# Patient Record
Sex: Female | Born: 1990 | Race: Black or African American | Hispanic: No | Marital: Single | Smoking: Never smoker
Health system: Southern US, Community
[De-identification: ages and names within clinical notes are randomized; demographics above are authoritative.]

## PROBLEM LIST (undated history)

## (undated) DIAGNOSIS — B9689 Other specified bacterial agents as the cause of diseases classified elsewhere: Secondary | ICD-10-CM

## (undated) DIAGNOSIS — K219 Gastro-esophageal reflux disease without esophagitis: Secondary | ICD-10-CM

## (undated) DIAGNOSIS — R87619 Unspecified abnormal cytological findings in specimens from cervix uteri: Secondary | ICD-10-CM

## (undated) DIAGNOSIS — R87810 Cervical high risk human papillomavirus (HPV) DNA test positive: Secondary | ICD-10-CM

## (undated) DIAGNOSIS — N83209 Unspecified ovarian cyst, unspecified side: Secondary | ICD-10-CM

## (undated) DIAGNOSIS — N760 Acute vaginitis: Secondary | ICD-10-CM

## (undated) DIAGNOSIS — N390 Urinary tract infection, site not specified: Secondary | ICD-10-CM

## (undated) DIAGNOSIS — R1310 Dysphagia, unspecified: Secondary | ICD-10-CM

## (undated) DIAGNOSIS — K5900 Constipation, unspecified: Secondary | ICD-10-CM

## (undated) DIAGNOSIS — R079 Chest pain, unspecified: Secondary | ICD-10-CM

## (undated) DIAGNOSIS — K5281 Eosinophilic gastritis or gastroenteritis: Secondary | ICD-10-CM

## (undated) DIAGNOSIS — Z8719 Personal history of other diseases of the digestive system: Secondary | ICD-10-CM

## (undated) DIAGNOSIS — R87610 Atypical squamous cells of undetermined significance on cytologic smear of cervix (ASC-US): Secondary | ICD-10-CM

## (undated) HISTORY — DX: Other specified bacterial agents as the cause of diseases classified elsewhere: B96.89

## (undated) HISTORY — DX: Atypical squamous cells of undetermined significance on cytologic smear of cervix (ASC-US): R87.610

## (undated) HISTORY — DX: Cervical high risk human papillomavirus (HPV) DNA test positive: R87.810

## (undated) HISTORY — DX: Unspecified abnormal cytological findings in specimens from cervix uteri: R87.619

## (undated) HISTORY — PX: INTRAUTERINE DEVICE (IUD) INSERTION: SHX5877

## (undated) HISTORY — PX: OTHER SURGICAL HISTORY: SHX169

## (undated) HISTORY — DX: Urinary tract infection, site not specified: N39.0

## (undated) HISTORY — DX: Constipation, unspecified: K59.00

## (undated) HISTORY — DX: Other specified bacterial agents as the cause of diseases classified elsewhere: N76.0

---

## 2010-07-28 ENCOUNTER — Emergency Department (HOSPITAL_COMMUNITY): Admission: EM | Admit: 2010-07-28 | Discharge: 2010-07-29 | Payer: Self-pay | Admitting: Emergency Medicine

## 2010-08-16 ENCOUNTER — Emergency Department (HOSPITAL_COMMUNITY): Admission: EM | Admit: 2010-08-16 | Discharge: 2010-08-02 | Payer: Self-pay | Admitting: Emergency Medicine

## 2010-09-09 HISTORY — PX: OTHER SURGICAL HISTORY: SHX169

## 2010-09-09 HISTORY — PX: APPENDECTOMY: SHX54

## 2010-11-20 LAB — BASIC METABOLIC PANEL
CO2: 24 mEq/L (ref 19–32)
Chloride: 102 mEq/L (ref 96–112)
Creatinine, Ser: 0.79 mg/dL (ref 0.4–1.2)
GFR calc Af Amer: >60 mL/min (ref >60)
Potassium: 3.8 mEq/L (ref 3.5–5.1)

## 2010-11-20 LAB — WET PREP, GENITAL: Yeast Wet Prep HPF POC: NONE SEEN

## 2010-11-20 LAB — POCT PREGNANCY, URINE: Preg Test, Ur: NEGATIVE

## 2010-11-20 LAB — CBC
HCT: 38.1 % (ref 36.0–46.0)
Hemoglobin: 13.4 g/dL (ref 12.0–15.0)
MCH: 31.5 pg (ref 26.0–34.0)
MCV: 89.9 fL (ref 78.0–100.0)
RBC: 4.24 MIL/uL (ref 3.87–5.11)
WBC: 2.6 10*3/uL — ABNORMAL LOW (ref 4.0–10.5)

## 2010-11-20 LAB — DIFFERENTIAL
Band Neutrophils: 0 % (ref 0–10)
Basophils Absolute: 0.1 10*3/uL (ref 0.0–0.1)
Basophils Relative: 2 % — ABNORMAL HIGH (ref 0–1)
Eosinophils Absolute: 0.1 10*3/uL (ref 0.0–0.7)
Eosinophils Relative: 5 % (ref 0–5)
Metamyelocytes Relative: 0 %
Monocytes Absolute: 0.5 10*3/uL (ref 0.1–1.0)
Monocytes Relative: 21 % — ABNORMAL HIGH (ref 3–12)
Myelocytes: 0 %
nRBC: 0 /100 WBC

## 2010-11-20 LAB — URINALYSIS, ROUTINE W REFLEX MICROSCOPIC
Bilirubin Urine: NEGATIVE
Glucose, UA: NEGATIVE mg/dL
Hgb urine dipstick: NEGATIVE
Specific Gravity, Urine: 1.025 (ref 1.005–1.030)
Urobilinogen, UA: 1 mg/dL (ref 0.0–1.0)

## 2010-11-20 LAB — GC/CHLAMYDIA PROBE AMP, GENITAL
Chlamydia, DNA Probe: NEGATIVE
GC Probe Amp, Genital: NEGATIVE

## 2012-04-02 ENCOUNTER — Encounter (HOSPITAL_COMMUNITY)
Admission: RE | Disposition: A | Discharge status: Home / Self Care | Payer: Self-pay | Source: Ambulatory Visit | Attending: Gastroenterology

## 2012-04-02 ENCOUNTER — Encounter (HOSPITAL_COMMUNITY): Payer: Self-pay | Admitting: *Deleted

## 2012-04-02 ENCOUNTER — Telehealth: Payer: Self-pay

## 2012-04-02 ENCOUNTER — Ambulatory Visit (HOSPITAL_COMMUNITY)
Admission: RE | Admit: 2012-04-02 | Discharge: 2012-04-02 | Disposition: A | Discharge status: Home / Self Care | Payer: BC Managed Care – PPO | Source: Ambulatory Visit | Attending: Gastroenterology | Admitting: Gastroenterology

## 2012-04-02 ENCOUNTER — Telehealth: Payer: Self-pay | Admitting: Gastroenterology

## 2012-04-02 DIAGNOSIS — R1310 Dysphagia, unspecified: Secondary | ICD-10-CM | POA: Insufficient documentation

## 2012-04-02 DIAGNOSIS — K2940 Chronic atrophic gastritis without bleeding: Secondary | ICD-10-CM | POA: Insufficient documentation

## 2012-04-02 DIAGNOSIS — K2990 Gastroduodenitis, unspecified, without bleeding: Secondary | ICD-10-CM

## 2012-04-02 DIAGNOSIS — Q391 Atresia of esophagus with tracheo-esophageal fistula: Secondary | ICD-10-CM | POA: Insufficient documentation

## 2012-04-02 DIAGNOSIS — K449 Diaphragmatic hernia without obstruction or gangrene: Secondary | ICD-10-CM | POA: Insufficient documentation

## 2012-04-02 DIAGNOSIS — Q393 Congenital stenosis and stricture of esophagus: Secondary | ICD-10-CM | POA: Insufficient documentation

## 2012-04-02 DIAGNOSIS — K219 Gastro-esophageal reflux disease without esophagitis: Secondary | ICD-10-CM

## 2012-04-02 DIAGNOSIS — R079 Chest pain, unspecified: Secondary | ICD-10-CM

## 2012-04-02 DIAGNOSIS — K2970 Gastritis, unspecified, without bleeding: Secondary | ICD-10-CM

## 2012-04-02 HISTORY — PX: ESOPHAGOGASTRODUODENOSCOPY: SHX1529

## 2012-04-02 HISTORY — DX: Gastro-esophageal reflux disease without esophagitis: K21.9

## 2012-04-02 LAB — CBC WITH DIFFERENTIAL/PLATELET
ALT: 12 U/L (ref 7–35)
Albumin: 4.3
Alkaline Phosphatase: 76 U/L
BUN: 10 mg/dL (ref 4–21)
Chloride: 104 mmol/L
HCT: 40 %
Hemoglobin: 13.4 g/dL (ref 12.0–16.0)
Platelets: 197 10*3/uL
Total Bilirubin: 0.9 mg/dL

## 2012-04-02 SURGERY — ESOPHAGOGASTRODUODENOSCOPY (EGD) WITH ESOPHAGEAL DILATION
Anesthesia: Moderate Sedation

## 2012-04-02 MED ORDER — SODIUM CHLORIDE 0.45 % IV SOLN
Freq: Once | INTRAVENOUS | Status: AC
  Administered 2012-04-02: 1000 mL via INTRAVENOUS

## 2012-04-02 MED ORDER — MIDAZOLAM HCL 5 MG/5ML IJ SOLN
INTRAMUSCULAR | Status: AC
  Filled 2012-04-02: qty 10

## 2012-04-02 MED ORDER — MIDAZOLAM HCL 5 MG/5ML IJ SOLN
INTRAMUSCULAR | Status: DC | PRN
  Administered 2012-04-02: 2 mg via INTRAVENOUS
  Administered 2012-04-02 (×2): 1 mg via INTRAVENOUS
  Administered 2012-04-02: 2 mg via INTRAVENOUS
  Administered 2012-04-02: 1 mg via INTRAVENOUS

## 2012-04-02 MED ORDER — MEPERIDINE HCL 100 MG/ML IJ SOLN
INTRAMUSCULAR | Status: DC | PRN
  Administered 2012-04-02 (×2): 50 mg via INTRAVENOUS

## 2012-04-02 MED ORDER — OMEPRAZOLE 20 MG PO CPDR: DELAYED_RELEASE_CAPSULE | ORAL | Status: DC

## 2012-04-02 MED ORDER — STERILE WATER FOR IRRIGATION IR SOLN
Status: DC | PRN
  Administered 2012-04-02: 13:00:00

## 2012-04-02 MED ORDER — MEPERIDINE HCL 100 MG/ML IJ SOLN
INTRAMUSCULAR | Status: AC
  Filled 2012-04-02: qty 2

## 2012-04-02 NOTE — Telephone Encounter (Signed)
REVIEWED.  

## 2012-04-02 NOTE — H&P (Signed)
  Primary Care Physician:  Sheila Oats, MD Primary Gastroenterologist:  Dr. Darrick Penna  Pre-Procedure History & Physical: HPI:  Beth Peterson is a 21 y.o. female here for CHEST PAIN (PRESSURE) AFTER TAKING FLAGYL. HAS PROBLEM TAKING PILLS. NO PAIN WITH SWALLOWING. REFLUX UNCONTROLLED WITH TUMS. TAKES TUMS EVERY DAY. HAS NAUSEA. NO VOMITING, ABD PAIN, DIARRHEA, MELENA, OR BRBPR. INTENTIONAL WEIGHT LOSS. APPETITE GOOD. LMP: ? WAS ON DEPO AND NOW ON MINERVA. SX RELIEVED WITH GI COCKTAIL YESTERDAY BUT CAM BACK THIS AM. MISSING WORK DUE TO PAIN.   Past Medical History  Diagnosis Date  . GERD (gastroesophageal reflux disease)     Past Surgical History  Procedure Date  . Uterus tacked 2012    Prior to Admission medications   Medication Sig Start Date End Date Taking? Authorizing Provider  metroNIDAZOLE (FLAGYL) 500 MG tablet Take 500 mg by mouth as needed.   Yes Historical Provider, MD    Allergies as of 04/02/2012  . (No Known Allergies)    History reviewed. No pertinent family history.  History   Social History  . Marital Status: Single    Spouse Name: N/A    Number of Children: N/A  . Years of Education: N/A   Occupational History  . Not on file.   Social History Main Topics  . Smoking status: Never Smoker   . Smokeless tobacco: Not on file  . Alcohol Use: Yes     occasional  . Drug Use: No  . Sexually Active: Yes    Birth Control/ Protection: IUD   Other Topics Concern  . Not on file   Social History Narrative  . No narrative on file    Review of Systems: See HPI, otherwise negative ROS   Physical Exam: BP 119/74  Temp 98.1 F (36.7 C) (Oral)  Resp 18  Ht 5\' 4"  (1.626 m)  Wt 163 lb (73.936 kg)  BMI 27.98 kg/m2  SpO2 100% General:   Alert,  pleasant and cooperative in NAD Head:  Normocephalic and atraumatic. Neck:  Supple;  Lungs:  Clear throughout to auscultation.    Heart:  Regular rate and rhythm. Abdomen:  Soft, nontender and nondistended.  Normal bowel sounds, without guarding, and without rebound.   Neurologic:  Alert and  oriented x4;  grossly normal neurologically.  Impression/Plan:    CHEST PAIN LIKEY DUE TO PILL ESOPHAGITIS. GERD NOT APPROPRIATELY TREATED AND NOT CONTROLLED. PT AT RISK FOR PEPTIC STRICTURE.  PLAN:  1. EGD/? DIL TODAY.

## 2012-04-02 NOTE — Telephone Encounter (Signed)
Need urgent appt due to severe dysphagia.

## 2012-04-02 NOTE — Telephone Encounter (Signed)
Doctor from [facility] call this morning wanting to know if we could see her ASAP because she can not swallow. I told them I would call them back. I paged SLF to see if she would like to go ahead and do an EGD or did she want [place] to see her in the office. SLF called back and said to see the Pt to Endo. Call Consuella Lose at [facility] and told her to please send her straight to the Endo department.

## 2012-04-03 NOTE — Op Note (Signed)
[facility] [address] [place], [place]  [address]  ENDOSCOPY PROCEDURE REPORT  PATIENT:  Beth Peterson, Beth Peterson  MR#:  [id number] BIRTHDATE:  [birth year], 21 yrs. old  GENDER:  female  ENDOSCOPIST:  Jonette Eva, MD ASSISTANT: Referred by:  Reynolds Bowl, M.D.  PROCEDURE DATE:  04/02/2012 PROCEDURE:  EGD with dilatation over guidewire, EGD with biopsy ASA CLASS: INDICATIONS:  CHEST PAIN AFTER SWALLOWING FLAGYL. PT HAS PROBLEMS SWALLOWING PILLS, REFLUX UNCONTROLLED.  MEDICATIONS:   Demerol 100 mg IV, Versed 7 mg IV TOPICAL ANESTHETIC:  Cetacaine Spray  DESCRIPTION OF PROCEDURE:   After the risks benefits and alternatives of the procedure were thoroughly explained, informed consent was obtained.  The EG-[id number] ([id number]) endoscope was introduced through the mouth and advanced to the second portion of the duodenum.  The instrument was slowly withdrawn as the mucosa was carefully examined.  Prior to withdrawal of the scope, the guidwire was placed.  The esophagus was dilated successfully.  The patient was recovered in endoscopy and discharged home in satisfactory condition. 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  NO ULCERS OR BARETT'S SEENIN THE ESOPHAGUS. A 1-2 CM hiatal hernia was found.  Mild gastritis was found & BIOPSIED VIA COLD FORCEPS.    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 CONTINUE WEIGHT LOSS  EFFORTS. OPV IN 3 MOS.  REPEAT  EXAM:  No  ______________________________ Jonette Eva, MD  CC:  n. eSIGNED:   Jaqlyn Gruenhagen at 04/03/2012 09:41 AM  Audrea Muscat, [phone]

## 2012-04-10 ENCOUNTER — Telehealth: Payer: Self-pay | Admitting: Gastroenterology

## 2012-04-10 NOTE — Telephone Encounter (Signed)
Please call pt. HER stomach Bx shows mild gastritis.HER ESOPHAGUS BIOPSIES ARE NORMAL. Continue OMP 30 minutes prior to meals BID FOR ONE MORE WEEK AND THEN ONCE DAILY. FOLLOW A LOW FTA DIET. OPV IN 3 MOS.

## 2012-04-10 NOTE — Telephone Encounter (Signed)
Left message for her to call us back.  

## 2012-04-10 NOTE — Telephone Encounter (Signed)
Recall made, 3 month OPV

## 2012-04-13 NOTE — Telephone Encounter (Signed)
Called and informed pt.  

## 2012-05-08 ENCOUNTER — Encounter: Payer: Self-pay | Admitting: Urgent Care

## 2012-05-08 ENCOUNTER — Ambulatory Visit (INDEPENDENT_AMBULATORY_CARE_PROVIDER_SITE_OTHER): Payer: BC Managed Care – PPO | Admitting: Urgent Care

## 2012-05-08 VITALS — BP 122/84 | HR 81 | Temp 98.0°F | Ht 63.0 in | Wt 155.0 lb

## 2012-05-08 DIAGNOSIS — K219 Gastro-esophageal reflux disease without esophagitis: Secondary | ICD-10-CM | POA: Insufficient documentation

## 2012-05-08 DIAGNOSIS — R1310 Dysphagia, unspecified: Secondary | ICD-10-CM | POA: Insufficient documentation

## 2012-05-08 DIAGNOSIS — R079 Chest pain, unspecified: Secondary | ICD-10-CM

## 2012-05-08 MED ORDER — OMEPRAZOLE 20 MG PO CPDR: 20.0000 mg | DELAYED_RELEASE_CAPSULE | Freq: Two times a day (BID) | ORAL | Status: DC

## 2012-05-08 NOTE — Progress Notes (Signed)
Referring Provider: Default, Provider, MD Primary Care Physician:  Ninfa Linden, NP Primary Gastroenterologist:  Dr. Jonette Eva  Chief Complaint  Patient presents with  . Chest Pain    HPI:  Beth Peterson is a 21 y.o. female here for follow up for dysphasia and chest pressure.  She feels like food & pills stop retrosternally.  She continues to have problems despite recent EGD with savory dilation. She had pain that continued 3 hrs after eating a banana the other day.  Denies any problems w/ liquids.  C/o nausea.  Denies vomiting or regurgitation.  C/o heartburn & indigestion despite omeprazole 20mg  daily.  When she was taking omeprazole 20 mg BID for 2 weeks after her EGD, she felt like she did a bit better.  Weight stable.  Appetite ok, but not eating as much due to pain w/ eating.  C/o frequent headaches & dizziness.  Does not have PCP.    Past Medical History  Diagnosis Date  . GERD (gastroesophageal reflux disease)     Past Surgical History  Procedure Date  . Uterus tacked 2012  . Esophagogastroduodenoscopy 04/02/12    Fields-proximal esophageal web, small hiatal hernia, mild non-H. pylori gastritis, 12.8-16mm savory dilation    Current Outpatient Prescriptions  Medication Sig Dispense Refill  . omeprazole (PRILOSEC) 20 MG capsule Take 1 capsule (20 mg total) by mouth 2 (two) times daily before a meal.  60 capsule  2  . DISCONTD: omeprazole (PRILOSEC) 20 MG capsule 1 po bid 30 minutes before meals bid for 2 weeks then once daily FOREVER  62 capsule  11  . DISCONTD: omeprazole (PRILOSEC) 20 MG capsule Take 20 mg by mouth daily.      Marland Kitchen ibuprofen (ADVIL,MOTRIN) 800 MG tablet Take 1 tablet by mouth as needed.        Allergies as of 05/08/2012  . (No Known Allergies)    Review of Systems: Gen: Denies any fever, chills, sweats, anorexia, fatigue, weakness, malaise, weight loss, and sleep disorder CV: Denies chest pain, angina, palpitations, syncope, orthopnea, PND,  peripheral edema, and claudication. Resp: Denies dyspnea at rest, dyspnea with exercise, cough, sputum, wheezing, coughing up blood, and pleurisy. GI: Denies vomiting blood, jaundice, and fecal incontinence.   Derm: Denies rash, itching, dry skin, hives, moles, warts, or unhealing ulcers.  Psych: Denies depression, anxiety, memory loss, suicidal ideation, hallucinations, paranoia, and confusion. Heme: Denies bruising, bleeding, and enlarged lymph nodes.  Physical Exam: BP 122/84  Pulse 81  Temp 98 F (36.7 C) (Temporal)  Ht 5\' 3"  (1.6 m)  Wt 155 lb (70.308 kg)  BMI 27.46 kg/m2  LMP 06/10/2011 General:   Alert,  Well-developed, well-nourished, pleasant and cooperative in NAD Eyes:  Sclera clear, no icterus.   Conjunctiva pink. Mouth:  No deformity or lesions, oropharynx pink and moist. Neck:  Supple; no masses or thyromegaly. Heart:  Regular rate and rhythm; no murmurs, clicks, rubs,  or gallops. Abdomen:  Normal bowel sounds.  No bruits.  Soft, non-tender and non-distended without masses, hepatosplenomegaly or hernias noted.  No guarding or rebound tenderness.   Rectal:  Deferred.  Msk:  Symmetrical without gross deformities.  Pulses:  Normal pulses noted. Extremities:  No clubbing or edema. Neurologic:  Alert and oriented x4;  grossly normal neurologically. Skin:  Intact without significant lesions or rashes.

## 2012-05-08 NOTE — Assessment & Plan Note (Signed)
Resume twice a day omeprazole 20mg .

## 2012-05-08 NOTE — Patient Instructions (Addendum)
Begin omeprazole 20mg  before breakfast & dinner Ba pill esophogram.  I will call you w/ results.

## 2012-05-08 NOTE — Assessment & Plan Note (Addendum)
Beth Peterson is a pleasant 21 y.o. female with persistent solid food and pill dysphagia despite recent savory dilation. She also has a significant amount of chest pain with eating. Differential diagnoses include achalasia, esophageal motility disorder spasm, or non-GI source of chest pain.  BPE for further evaluation We'll request recent lab work from [facility] for review

## 2012-05-12 ENCOUNTER — Ambulatory Visit (HOSPITAL_COMMUNITY)
Admission: RE | Admit: 2012-05-12 | Discharge: 2012-05-12 | Disposition: A | Discharge status: Home / Self Care | Payer: BC Managed Care – PPO | Source: Ambulatory Visit | Attending: Urgent Care | Admitting: Urgent Care

## 2012-05-12 DIAGNOSIS — K449 Diaphragmatic hernia without obstruction or gangrene: Secondary | ICD-10-CM | POA: Insufficient documentation

## 2012-05-12 DIAGNOSIS — R1310 Dysphagia, unspecified: Secondary | ICD-10-CM | POA: Insufficient documentation

## 2012-05-12 DIAGNOSIS — K219 Gastro-esophageal reflux disease without esophagitis: Secondary | ICD-10-CM | POA: Insufficient documentation

## 2012-05-12 NOTE — Progress Notes (Signed)
Faxed to PCP

## 2012-05-13 NOTE — Progress Notes (Signed)
Called, Pt's mom answered. She was aware that pt would have samples. I told her I would schedule the follow up with PCP. I called [facility] and spoke with Debbie. Scheduled  Pt an appt for 05/25/2012 @ 1:30 PM with Dr. Agustin Cree. I will call and let pt's mom know.

## 2012-05-13 NOTE — Progress Notes (Signed)
Called pt's mom with date and time of appt. Asked them to please call [facility] if time is not suitable.

## 2012-05-13 NOTE — Progress Notes (Signed)
Quick Note:  Discussed w/ pt. Feels constipated & omeprazole 20mg  BID helps some but still has reflux. Please give pt DEXILANT 60mg  samples as below. Advised Miralax 17 grams daily prn constipation, hold if diarrhea. Call w/ PR in 10 days re: dexilant thanks ______

## 2012-05-13 NOTE — Progress Notes (Signed)
Quick Note:  Pt's mom aware samples are ready for pick up. ______

## 2012-05-13 NOTE — Progress Notes (Signed)
Quick Note:  Left message with female for return call. Patient has significant reflux and hiatal hernia on this study. No stricture or blockage. How does she feel on twice a day omeprazole? If not 100% relief, we should try Dexilant 60 mg daily and samples can be given for 2 weeks. Thanks CC: Forest Gleason, MD  ______

## 2012-05-13 NOTE — Progress Notes (Signed)
Labs reviewed from Beth Peterson 04/02/12. White blood cell count 3.2 is low. She will need followup with [facility] to have this rechecked soon. Please be sure she has appointment. Hemoglobin, hematocrit, platelet count normal. CMP normal. H. pylori negative. Thanks

## 2012-05-14 NOTE — Progress Notes (Signed)
Quick Note:    reviewed  ______

## 2012-06-29 ENCOUNTER — Telehealth: Payer: Self-pay | Admitting: *Deleted

## 2012-06-29 NOTE — Telephone Encounter (Signed)
Beth Peterson called today. She recently had strep throat and her Ob gyn put her on penacillinan. Since then, her acid reflux has come back and the medication she is taking is not working. Please follow up.

## 2012-06-29 NOTE — Telephone Encounter (Signed)
Called pt. She said she tried the Dexilant and it did OK, but insurance would not cover it, so [facility] put her on pantoprozole 40 mg qd. She said it is not helping

## 2012-06-30 ENCOUNTER — Telehealth: Payer: Self-pay

## 2012-06-30 NOTE — Telephone Encounter (Signed)
Please give pt 2 weeks dexilant 60mg  samples. Let's see if we can get Prior Auth on insurance. Thanks

## 2012-06-30 NOTE — Telephone Encounter (Signed)
Working on PA

## 2012-06-30 NOTE — Telephone Encounter (Signed)
Called and informed pt. Samples at front for pick up.  

## 2012-06-30 NOTE — Telephone Encounter (Signed)
Routing to Lorenza Burton, NP who saw pt last.

## 2012-06-30 NOTE — Telephone Encounter (Signed)
Pt called back to say she could not pick up the dexilant today. Also, that she has not eaten anything since yesterday. She had baked chicken, turnip greens and mashed potatoes yesterday and was up til 2 AM this morning with reflux. She feels nausea, feels like the food comes up and goes back down, but she has not vomited. She took one of the Pantoprazole this AM, but it did not help. Please advise!  ( She can be reached at [phone] for the next hour on her lunch break).

## 2012-06-30 NOTE — Telephone Encounter (Signed)
Called and informed pt per Lorenza Burton, NP, she may take a zantac or either another Pantoprozole today and try to get the Dexilant as soon as possible.

## 2012-07-01 NOTE — Telephone Encounter (Signed)
PA form on AS desk. 

## 2012-07-03 ENCOUNTER — Telehealth: Payer: Self-pay

## 2012-07-03 NOTE — Telephone Encounter (Signed)
She needs appt to discuss further.  In interim, OTC colace BID & 1 Fleets enema for constipation.  Stop Dexilant.   OV 1st of next week. Thanks

## 2012-07-03 NOTE — Telephone Encounter (Signed)
Vm from pt this AM transferred from [place]. I called pt. She said she can still hardly eat anything. She has not had a good BM since Mon. ( she has not been taking the Miralax, said it caused diarrhea). She had black ball stools yesterday. She has pain in her lower right side. Nausea but no vomiting. She was taking the Dexilant, but had chest pain after she took it yesterday, so she is not taking it this AM. She said her pain is not so bad until she starts moving around.  Please advise!

## 2012-07-03 NOTE — Telephone Encounter (Signed)
Called and informed pt. Appt with Beth Peterson on 07/06/2012 at 10:30 AM.

## 2012-07-06 ENCOUNTER — Ambulatory Visit (INDEPENDENT_AMBULATORY_CARE_PROVIDER_SITE_OTHER): Payer: BC Managed Care – PPO | Admitting: Urgent Care

## 2012-07-06 ENCOUNTER — Encounter: Payer: Self-pay | Admitting: Urgent Care

## 2012-07-06 VITALS — BP 124/75 | HR 89 | Temp 97.5°F | Ht 63.0 in | Wt 147.4 lb

## 2012-07-06 DIAGNOSIS — R1310 Dysphagia, unspecified: Secondary | ICD-10-CM

## 2012-07-06 DIAGNOSIS — K5900 Constipation, unspecified: Secondary | ICD-10-CM

## 2012-07-06 DIAGNOSIS — K219 Gastro-esophageal reflux disease without esophagitis: Secondary | ICD-10-CM

## 2012-07-06 DIAGNOSIS — R350 Frequency of micturition: Secondary | ICD-10-CM

## 2012-07-06 DIAGNOSIS — R079 Chest pain, unspecified: Secondary | ICD-10-CM

## 2012-07-06 MED ORDER — LINACLOTIDE 290 MCG PO CAPS: 290.0000 ug | ORAL_CAPSULE | Freq: Every day | ORAL | Status: DC

## 2012-07-06 MED ORDER — LINACLOTIDE 290 MCG PO CAPS: 1.0000 | ORAL_CAPSULE | Freq: Every day | ORAL | Status: DC

## 2012-07-06 MED ORDER — PANTOPRAZOLE SODIUM 40 MG PO TBEC: 40.0000 mg | DELAYED_RELEASE_TABLET | Freq: Every day | ORAL | Status: DC

## 2012-07-06 NOTE — Patient Instructions (Addendum)
Go to lab for urine specimen Linzess daily for constipation-Hold if diarrhea Pantoprazole 40mg  daily for acid reflux Call if no relief. Office visit in 3 months w/ Dr Darrick Penna

## 2012-07-06 NOTE — Progress Notes (Signed)
Primary Care Physician:  Forest Gleason, MD Primary Gastroenterologist:  Dr. Jonette Eva  Chief Complaint  Patient presents with  . Abdominal Pain  . Constipation    HPI:  Beth Peterson is a 21 y.o. female here for follow up GERD and dysphagia. She had persistent dysphasia, mostly with solid foods as this EGD with esophageal dilation. She had a pair esophagram on 05/12/12. This showed a small sliding-type hiatal hernia with inducible GERD.  She was started on Dexilant 60 mg daily since she was having breakthrough symptoms on omeprazole 20 mg twice a day. She also having some constipation MiraLax 17 g daily was initiated.  Today she presents complaining of nausea that started one week ago.  2 weeks ago she was treated with PCN for UTI [facility].  She completed antibiotics for this, but stills feels bad.  She continues to have increased urinary frequency.  She did not feel she was having adequate results with MiraLax, so she was advise to 2 a fleets enema & stool softeners.  She feels like she only had moderate results with this and still feels incomplete.  Denies vomiting.  Denies rectal bleeding or melena.  Denies fever or chills.  She has not been eating because she feels terrible after eating.  C/o severe nausea.  She developed chest pain "like a bag of rocks around her neck" with dexilant so it was stopped 3 days ago.  Still having heartburn daily.  She felt that Protonix 40 mg daily worked best for her. Denies dysphagia or odynophagia at this time.  BM last week was a hard black ball.  C/o RLQ pain 5 days ago, lasted 3 days, now resolved.  She is status post appendectomy 07/09/11 by Dr Fransico Meadow in [facility].  Past Medical History  Diagnosis Date  . GERD (gastroesophageal reflux disease)     Past Surgical History  Procedure Date  . Uterus tacked 2012  . Esophagogastroduodenoscopy 04/02/12    Fields-proximal esophageal web, small hiatal hernia, mild non-H. pylori gastritis, 12.8-16mm  savory dilation  . Appendectomy 2012    [facility]-Dr [id number]    Current Outpatient Prescriptions  Medication Sig Dispense Refill  . amitriptyline (ELAVIL) 25 MG tablet Take 1 tablet by mouth At bedtime.      Marland Kitchen ibuprofen (ADVIL,MOTRIN) 800 MG tablet Take 1 tablet by mouth as needed.      . Linaclotide (LINZESS) 290 MCG CAPS Take 1 capsule by mouth daily.  31 capsule  5  . Linaclotide (LINZESS) 290 MCG CAPS Take 290 mcg by mouth daily.  8 capsule  0  . pantoprazole (PROTONIX) 40 MG tablet Take 1 tablet (40 mg total) by mouth daily.  31 tablet  5    Allergies as of 07/06/2012 - Review Complete 07/06/2012  Allergen Reaction Noted  . Dexilant (dexlansoprazole) Other (See Comments) 07/06/2012    Review of Systems: Gen: Denies any fever, chills, sweats, anorexia, fatigue, weakness, malaise, weight loss, and sleep disorder CV: Denies chest pain, angina, palpitations, syncope, orthopnea, PND, peripheral edema, and claudication. Resp: Denies dyspnea at rest, dyspnea with exercise, cough, sputum, wheezing, coughing up blood, and pleurisy. GI: Denies vomiting blood, jaundice, and fecal incontinence.   GU: See history of present illness.  Complains of dysuria. Denies hematuria Derm: Denies rash, itching, dry skin, hives, moles, warts, or unhealing ulcers.  Psych: Denies depression, anxiety, memory loss, suicidal ideation, hallucinations, paranoia, and confusion. Heme: Denies bruising, bleeding, and enlarged lymph nodes.  Physical Exam: BP 124/75  Pulse 89  Temp 97.5 F (36.4 C) (Temporal)  Ht 5\' 3"  (1.6 m)  Wt 147 lb 6.4 oz (66.86 kg)  BMI 26.11 kg/m2 General:   Alert,  Well-developed, well-nourished, pleasant and cooperative in NAD Eyes:  Sclera clear, no icterus.   Conjunctiva pink. Mouth:  No deformity or lesions, oropharynx pink and moist. Neck:  Supple; no masses or thyromegaly. Heart:  Regular rate and rhythm; no murmurs, clicks, rubs,  or gallops. Abdomen:  Normal bowel sounds.  No  bruits.  Soft, non-distended without masses, hepatosplenomegaly or hernias noted.  Mild right lower quadrant tenderness on deep palpation.  No guarding or rebound tenderness.   Rectal:  Deferred.  Msk:  Symmetrical without gross deformities.  Pulses:  Normal pulses noted. Extremities:  No clubbing or edema. Neurologic:  Alert and oriented x4;  grossly normal neurologically. Skin:  Intact without significant lesions or rashes.

## 2012-07-07 ENCOUNTER — Encounter: Payer: Self-pay | Admitting: Urgent Care

## 2012-07-07 DIAGNOSIS — K5900 Constipation, unspecified: Secondary | ICD-10-CM | POA: Insufficient documentation

## 2012-07-07 DIAGNOSIS — R350 Frequency of micturition: Secondary | ICD-10-CM | POA: Insufficient documentation

## 2012-07-07 LAB — URINALYSIS, ROUTINE W REFLEX MICROSCOPIC
Nitrite: NEGATIVE
Specific Gravity, Urine: 1.026 (ref 1.005–1.030)
Urobilinogen, UA: 0.2 mg/dL (ref 0.0–1.0)

## 2012-07-07 NOTE — Assessment & Plan Note (Signed)
Failed omeprazole 20 mg twice a day. Chest pain with Dexilant. She should begin Pantoprazole 40mg  daily for acid reflux

## 2012-07-07 NOTE — Assessment & Plan Note (Signed)
Failed miralax.  Suspect culprit of abdominal pain. Begin Linzess daily for constipation-Hold if diarrhea Call if no relief. Office visit in 3 months w/ Dr Darrick Penna

## 2012-07-07 NOTE — Progress Notes (Signed)
Faxed to PCP

## 2012-07-07 NOTE — Assessment & Plan Note (Signed)
Recent UTI treated w/ antibiotics, now with recurrent frequency & dysuria.   Repeat UA.  If negative, FU with Forest Gleason, MD

## 2012-07-13 ENCOUNTER — Telehealth: Payer: Self-pay | Admitting: *Deleted

## 2012-07-13 NOTE — Telephone Encounter (Signed)
Routing back to [place].

## 2012-07-13 NOTE — Telephone Encounter (Signed)
Ms Vertucci called today to follow up on her pre authorization for her prescription and also to let us know that the medication she got from her last appt is working well. Thanks.

## 2012-07-13 NOTE — Telephone Encounter (Signed)
LMOM to call.  ( Samples and rebate card at front for pick up).

## 2012-07-13 NOTE — Telephone Encounter (Signed)
Be sure pt gets rebate card.  Not sure if this will help on linzess. Can give #3 boxes of samples Linzess daily to help with cost. All meds, even OTC, are roughly $30 or more for constipation. Thanks

## 2012-07-13 NOTE — Telephone Encounter (Signed)
I called pt. She said she has not went to pick up the medicine. I told her it looks like Kandice sent in Rx's for Linzess and the Pantoprazole to [facility] in [place]. (She was confused about the PA for Dexilant, which she is not taking now. She will check on the the prescriptions at [facility] and let her mom pick them up. She checked on them and called back and said she could not afford $30.00 for Linzess. I told her that was not a bad price and she said she could not afford to please prescribe something else.

## 2012-07-13 NOTE — Telephone Encounter (Signed)
Called and spoke to pt. She said she is doing well on the Pntoprazole. She will need more. She did not not have refills on the one given by PCP> but for now it is working well and she would like Rx sent to [facility] in [place]. I wlll route the note to Raynelle Fanning to check on PA for the Linzess.

## 2012-07-14 ENCOUNTER — Telehealth: Payer: Self-pay | Admitting: *Deleted

## 2012-07-14 NOTE — Telephone Encounter (Signed)
See phone note of 07/13/2012.

## 2012-07-14 NOTE — Telephone Encounter (Signed)
Returned pt's call. LMOM for a return call.  

## 2012-07-14 NOTE — Telephone Encounter (Signed)
Beth Peterson called today, returning your call. Please call her back. Thanks.

## 2012-07-15 NOTE — Telephone Encounter (Signed)
Called and informed pt.  

## 2012-08-26 ENCOUNTER — Encounter: Payer: Self-pay | Admitting: *Deleted

## 2012-10-15 ENCOUNTER — Ambulatory Visit: Payer: Self-pay | Admitting: Obstetrics & Gynecology

## 2012-10-15 LAB — CBC
HCT: 41.1 % (ref 35.0–47.0)
HGB: 13.8 g/dL (ref 12.0–16.0)
MCH: 31.2 pg (ref 26.0–34.0)
RDW: 13 % (ref 11.5–14.5)
WBC: 3.9 10*3/uL (ref 3.6–11.0)

## 2012-10-16 ENCOUNTER — Ambulatory Visit: Payer: Self-pay | Admitting: Obstetrics & Gynecology

## 2012-10-19 LAB — PATHOLOGY REPORT

## 2012-10-26 ENCOUNTER — Telehealth: Payer: Self-pay

## 2012-10-26 NOTE — Telephone Encounter (Signed)
Pt called and said the Pantoprazole 40 mg qd is not controlling her reflux and heartburn. She said she still gets the chest pain/ heartburn and she would like to know if she can have a change in medications or should she come in for OV. She is aware it will probably be tomorrow before she hears back from [place]. I told her if her chest pain gets bad to go to the ED . She said she had a grilled chicken sandwich and she has hurt most of the day. Please advise!

## 2012-10-27 NOTE — Telephone Encounter (Signed)
Please call pt. She can pick up Nexium samples 40mg  daily x 2 weeks. If this does not work, she will need an OV. Thanks

## 2012-10-27 NOTE — Telephone Encounter (Signed)
Called and informed pt. Samples at front for pick up.  

## 2012-11-12 ENCOUNTER — Ambulatory Visit (INDEPENDENT_AMBULATORY_CARE_PROVIDER_SITE_OTHER): Payer: BC Managed Care – PPO | Admitting: Gastroenterology

## 2012-11-12 ENCOUNTER — Encounter: Payer: Self-pay | Admitting: Gastroenterology

## 2012-11-12 ENCOUNTER — Ambulatory Visit (HOSPITAL_COMMUNITY)
Admission: RE | Admit: 2012-11-12 | Discharge: 2012-11-12 | Disposition: A | Discharge status: Home / Self Care | Payer: BC Managed Care – PPO | Source: Ambulatory Visit | Attending: Gastroenterology | Admitting: Gastroenterology

## 2012-11-12 ENCOUNTER — Other Ambulatory Visit: Payer: Self-pay | Admitting: Gastroenterology

## 2012-11-12 VITALS — BP 123/79 | HR 88 | Temp 98.5°F | Ht 66.0 in | Wt 139.4 lb

## 2012-11-12 DIAGNOSIS — R0602 Shortness of breath: Secondary | ICD-10-CM | POA: Insufficient documentation

## 2012-11-12 DIAGNOSIS — Z8742 Personal history of other diseases of the female genital tract: Secondary | ICD-10-CM

## 2012-11-12 DIAGNOSIS — R079 Chest pain, unspecified: Secondary | ICD-10-CM

## 2012-11-12 DIAGNOSIS — Z9889 Other specified postprocedural states: Secondary | ICD-10-CM | POA: Insufficient documentation

## 2012-11-12 DIAGNOSIS — Z791 Long term (current) use of non-steroidal anti-inflammatories (NSAID): Secondary | ICD-10-CM

## 2012-11-12 DIAGNOSIS — K219 Gastro-esophageal reflux disease without esophagitis: Secondary | ICD-10-CM

## 2012-11-12 DIAGNOSIS — K5900 Constipation, unspecified: Secondary | ICD-10-CM

## 2012-11-12 MED ORDER — LINACLOTIDE 290 MCG PO CAPS: 1.0000 | ORAL_CAPSULE | Freq: Every day | ORAL | Status: DC

## 2012-11-12 MED ORDER — OMEPRAZOLE 20 MG PO CPDR: DELAYED_RELEASE_CAPSULE | ORAL | Status: DC

## 2012-11-12 MED ORDER — IOHEXOL 350 MG/ML SOLN
100.0000 mL | Freq: Once | INTRAVENOUS | Status: AC | PRN
  Administered 2012-11-12: 100 mL via INTRAVENOUS

## 2012-11-12 MED ORDER — IBUPROFEN 800 MG PO TABS: ORAL_TABLET | ORAL | Status: DC

## 2012-11-12 MED ORDER — ESOMEPRAZOLE MAGNESIUM 40 MG PO CPDR: DELAYED_RELEASE_CAPSULE | ORAL | Status: DC

## 2012-11-12 NOTE — Progress Notes (Addendum)
  Subjective:    Patient ID: Beth Peterson, female    DOB: 02-09-1991, 22 y.o.   MRN: [id number]  PCP: [facility]-DR. HARRIS, [place]  HPI HAD SURGERY 3 WEEKS AGO: CYST ON OVARIES AT [facility]. CONSTANT SINCE 2 WEEKS.  CHEST PAIN FOR 2 WEEKS. THINKING IT WAS FROM HEARTBURN. WAS ON PROTONIX FOR HEARTBURN FOR ONE YEAR. WAS IN DEXILANT BUT FELT IT CAUSED CHEST PAIN, AND SWITCHED TO PROTONIX. PROTONIX WAS TREATING ACID REFLUX/HEARTBURN. CURRENT PAIN DOES A LOT FEEL LIKE HEARTBURN. IT FEELS LIKE SOMETHING IS STUCK/PRESSURE.  HURTS IN MIDDLE OF CHEST. MILD SOB, & DOE. NO COUGH. MILD NAUSEA. HAS CONSTIPATION: LINZESS-BM: Q1-2 DAYS. NO NASAL CONGESTION RIGHT NOW-TAKES ADVIL COLD AND SINUS PRN. NO BURNING SENSATION IN CHEST RIGHT NOW.  PT DENIES FEVER, CHILLS, BRBPR, vomiting, melena, diarrhea, abd pain, problems swallowing, heartburn or indigestion.  LMP: MYRENA IUD  Past Medical History  Diagnosis Date  . GERD (gastroesophageal reflux disease)   . Constipation    Past Surgical History  Procedure Laterality Date  . Uterus tacked  2012  . Esophagogastroduodenoscopy  04/02/12    Fields-proximal esophageal web, small hiatal hernia, mild non-H. pylori gastritis, 12.8-16mm savory dilation  . Appendectomy  2012    [facility]-Dr [id number]  . Intrauterine device (iud) insertion     Allergies  Allergen Reactions  . Dexilant (Dexlansoprazole) Other (See Comments)    Chest pain-No help for GERD Fine on PANTOPRAZOLE previously   Current Outpatient Prescriptions  Medication Sig Dispense Refill  . amitriptyline (ELAVIL) 25 MG tablet Take 1 tablet by mouth At bedtime.      Marland Kitchen ibuprofen (ADVIL,MOTRIN) 800 MG tablet Take 1 tablet by mouth as needed.     . Linaclotide (LINZESS) 290 MCG CAPS Take 1 capsule by mouth daily.    Marland Kitchen PROTONIX QD    .          Review of Systems     Objective:   Physical Exam  Vitals reviewed. Constitutional: She is oriented to person, place, and time. She appears  well-nourished. No distress.  HENT:  Head: Normocephalic and atraumatic.  Mouth/Throat: Oropharynx is clear and moist. No oropharyngeal exudate.  Eyes: Pupils are equal, round, and reactive to light. No scleral icterus.  Neck: Normal range of motion. Neck supple.  Cardiovascular: Normal rate, regular rhythm and normal heart sounds.   Pulmonary/Chest: Effort normal and breath sounds normal. No respiratory distress. She exhibits tenderness (BIL AS THE STERNUM MEETS THE RIB CARTILAGE-REPRODUCES CHEST COMPLAINT).  Abdominal: Soft. Bowel sounds are normal. She exhibits no distension. There is no tenderness.  Musculoskeletal: Normal range of motion. She exhibits no edema (TRACE).  CHEST PAIN REPRODUCED WITH RESISTANCE TO EF/EE PRESSURE INCREASED IN CHEST WITH RESISTANCE TO UE ABD-/ADDUCTION  Lymphadenopathy:    She has no cervical adenopathy.  Neurological: She is alert and oriented to person, place, and time.  NO  NEW FOCAL DEFICITS   Psychiatric: She has a normal mood and affect.          Assessment & Plan:

## 2012-11-12 NOTE — Assessment & Plan Note (Signed)
SX CONTROLLED WITH LINZESS.  CONTINUE LINZESS. HIGH FIBER DIET. DRINK WATER TO KEEP URINE LIGHT YELLOW. OPV IN 2 MOS.

## 2012-11-12 NOTE — Patient Instructions (Addendum)
GET YOUR CHEST CT TODAY TAKE IBUPROFEN 800 MG Q6H FOR 3 DAYS THEN 1 PO TID FOR 7 DAYS. TAKE WITH FOOD OR MILK. IBUPROFEN SIDE EFFECTS INCLUDE GASTRITIS, ULCERS, AND INTERNAL BLEEDING. NEXIUM WILL HELP PROTECT YOUR STOMACH AND SMALL INTESTINES.  ICE PACK THREE TIMES A DAY HELP YOUR CHEST PAIN. DO NOT APPLY ICE PACKS DIRECTLY TO YOUR SKIN.  CONTINUE LINZESS. FOLLOW A HIGH FIBER/LOW FAT DIET. SEE INFO BELOW. DRINK WATER TO KEEP URINE LIGHT YELLOW. FOLLOW UP IN 2 MOS.   Low-Fat Diet BREADS, CEREALS, PASTA, RICE, DRIED PEAS, AND BEANS These products are high in carbohydrates and most are low in fat. Therefore, they can be increased in the diet as substitutes for fatty foods. They too, however, contain calories and should not be eaten in excess. Cereals can be eaten for snacks as well as for breakfast.   FRUITS AND VEGETABLES It is good to eat fruits and vegetables. Besides being sources of fiber, both are rich in vitamins and some minerals. They help you get the daily allowances of these nutrients. Fruits and vegetables can be used for snacks and desserts.  MEATS Limit lean meat, chicken, [place], and fish to no more than 6 ounces per day. Beef, Pork, and Lamb Use lean cuts of beef, pork, and lamb. Lean cuts include:  Extra-lean ground beef.  Arm roast.  Sirloin tip.  Center-cut ham.  Round steak.  Loin chops.  Rump roast.  Tenderloin.  Trim all fat off the outside of meats before cooking. It is not necessary to severely decrease the intake of red meat, but lean choices should be made. Lean meat is rich in protein and contains a highly absorbable form of iron. Premenopausal women, in particular, should avoid reducing lean red meat because this could increase the risk for low red blood cells (iron-deficiency anemia).  Chicken and [place] These are good sources of protein. The fat of poultry can be reduced by removing the skin and underlying fat layers before cooking. Chicken and [place] can  be substituted for lean red meat in the diet. Poultry should not be fried or covered with high-fat sauces. Fish and Shellfish Fish is a good source of protein. Shellfish contain cholesterol, but they usually are low in saturated fatty acids. The preparation of fish is important. Like chicken and [place], they should not be fried or covered with high-fat sauces. EGGS Egg whites contain no fat or cholesterol. They can be eaten often. Try 1 to 2 egg whites instead of whole eggs in recipes or use egg substitutes that do not contain yolk. MILK AND DAIRY PRODUCTS Use skim or 1% milk instead of 2% or whole milk. Decrease whole milk, natural, and processed cheeses. Use nonfat or low-fat (2%) cottage cheese or low-fat cheeses made from vegetable oils. Choose nonfat or low-fat (1 to 2%) yogurt. Experiment with evaporated skim milk in recipes that call for heavy cream. Substitute low-fat yogurt or low-fat cottage cheese for sour cream in dips and salad dressings. Have at least 2 servings of low-fat dairy products, such as 2 glasses of skim (or 1%) milk each day to help get your daily calcium intake. FATS AND OILS Reduce the total intake of fats, especially saturated fat. Butterfat, lard, and beef fats are high in saturated fat and cholesterol. These should be avoided as much as possible. Vegetable fats do not contain cholesterol, but certain vegetable fats, such as coconut oil, palm oil, and palm kernel oil are very high in saturated fats. These should  be limited. These fats are often used in bakery goods, processed foods, popcorn, oils, and nondairy creamers. Vegetable shortenings and some peanut butters contain hydrogenated oils, which are also saturated fats. Read the labels on these foods and check for saturated vegetable oils. Unsaturated vegetable oils and fats do not raise blood cholesterol. However, they should be limited because they are fats and are high in calories. Total fat should still be limited to 30%  of your daily caloric intake. Desirable liquid vegetable oils are corn oil, cottonseed oil, olive oil, canola oil, safflower oil, soybean oil, and sunflower oil. Peanut oil is not as good, but small amounts are acceptable. Buy a heart-healthy tub margarine that has no partially hydrogenated oils in the ingredients. Mayonnaise and salad dressings often are made from unsaturated fats, but they should also be limited because of their high calorie and fat content. Seeds, nuts, peanut butter, olives, and avocados are high in fat, but the fat is mainly the unsaturated type. These foods should be limited mainly to avoid excess calories and fat. OTHER EATING TIPS Snacks  Most sweets should be limited as snacks. They tend to be rich in calories and fats, and their caloric content outweighs their nutritional value. Some good choices in snacks are graham crackers, melba toast, soda crackers, bagels (no egg), English muffins, fruits, and vegetables. These snacks are preferable to snack crackers, [place] fries, TORTILLA CHIPS, and POTATO chips. Popcorn should be air-popped or cooked in small amounts of liquid vegetable oil. Desserts Eat fruit, low-fat yogurt, and fruit ices instead of pastries, cake, and cookies. Sherbet, angel food cake, gelatin dessert, frozen low-fat yogurt, or other frozen products that do not contain saturated fat (pure fruit juice bars, frozen ice pops) are also acceptable.  COOKING METHODS Choose those methods that use little or no fat. They include: Poaching.  Braising.  Steaming.  Grilling.  Baking.  Stir-frying.  Broiling.  Microwaving.  Foods can be cooked in a nonstick pan without added fat, or use a nonfat cooking spray in regular cookware. Limit fried foods and avoid frying in saturated fat. Add moisture to lean meats by using water, broth, cooking wines, and other nonfat or low-fat sauces along with the cooking methods mentioned above. Soups and stews should be chilled after  cooking. The fat that forms on top after a few hours in the refrigerator should be skimmed off. When preparing meals, avoid using excess salt. Salt can contribute to raising blood pressure in some people.  EATING AWAY FROM HOME Order entres, potatoes, and vegetables without sauces or butter. When meat exceeds the size of a deck of cards (3 to 4 ounces), the rest can be taken home for another meal. Choose vegetable or fruit salads and ask for low-calorie salad dressings to be served on the side. Use dressings sparingly. Limit high-fat toppings, such as bacon, crumbled eggs, cheese, sunflower seeds, and olives. Ask for heart-healthy tub margarine instead of butter.  High-Fiber Diet A high-fiber diet changes your normal diet to include more whole grains, legumes, fruits, and vegetables. Changes in the diet involve replacing refined carbohydrates with unrefined foods. The calorie level of the diet is essentially unchanged. The Dietary Reference Intake (recommended amount) for adult males is 38 grams per day. For adult females, it is 25 grams per day. Pregnant and lactating women should consume 28 grams of fiber per day. Fiber is the intact part of a plant that is not broken down during digestion. Functional fiber is fiber that has been  isolated from the plant to provide a beneficial effect in the body. PURPOSE  Increase stool bulk.   Ease and regulate bowel movements.   Lower cholesterol.  INDICATIONS THAT YOU NEED MORE FIBER  Constipation and hemorrhoids.   Uncomplicated diverticulosis (intestine condition) and irritable bowel syndrome.   Weight management.   As a protective measure against hardening of the arteries (atherosclerosis), diabetes, and cancer.   GUIDELINES FOR INCREASING FIBER IN THE DIET  Start adding fiber to the diet slowly. A gradual increase of about 5 more grams (2 slices of whole-wheat bread, 2 servings of most fruits or vegetables, or 1 bowl of high-fiber cereal) per  day is best. Too rapid an increase in fiber may result in constipation, flatulence, and bloating.   Drink enough water and fluids to keep your urine clear or pale yellow. Water, juice, or caffeine-free drinks are recommended. Not drinking enough fluid may cause constipation.   Eat a variety of high-fiber foods rather than one type of fiber.   Try to increase your intake of fiber through using high-fiber foods rather than fiber pills or supplements that contain small amounts of fiber.   The goal is to change the types of food eaten. Do not supplement your present diet with high-fiber foods, but replace foods in your present diet.  INCLUDE A VARIETY OF FIBER SOURCES  Replace refined and processed grains with whole grains, canned fruits with fresh fruits, and incorporate other fiber sources. White rice, white breads, and most bakery goods contain little or no fiber.   Brown whole-grain rice, buckwheat oats, and many fruits and vegetables are all good sources of fiber. These include: broccoli, Brussels sprouts, cabbage, cauliflower, beets, sweet potatoes, white potatoes (skin on), carrots, tomatoes, eggplant, squash, berries, fresh fruits, and dried fruits.   Cereals appear to be the richest source of fiber. Cereal fiber is found in whole grains and bran. Bran is the fiber-rich outer coat of cereal grain, which is largely removed in refining. In whole-grain cereals, the bran remains. In breakfast cereals, the largest amount of fiber is found in those with "bran" in their names. The fiber content is sometimes indicated on the label.   You may need to include additional fruits and vegetables each day.   In baking, for 1 cup white flour, you may use the following substitutions:   1 cup whole-wheat flour minus 2 tablespoons.   1/2 cup white flour plus 1/2 cup whole-wheat flour.

## 2012-11-12 NOTE — Progress Notes (Signed)
Faxed to PCP

## 2012-11-12 NOTE — Assessment & Plan Note (Signed)
FAIRLY WELLCONTROLLED.  CONTINUE NEXIUM. INCREASE TO BID FOR THE NEXT 2 WEEKS. LOW FAT DIET. OPV IN 2 MOS

## 2012-11-12 NOTE — Assessment & Plan Note (Signed)
MOST LIKELY DUE TO MUSCULOSKELETAL CHEST WALL PAIN, LESS LIKELY PULMONARY EMBOLUS.  CHEST CT TODAY IBUPROFEN 800 MG Q6H FOR 3 DAYS THEN 1 PO TID FOR 7 DAYS ICE PACK TID TO CHEST FOR PAIN CONTROL. OPV IN 2 MOS

## 2012-11-12 NOTE — Assessment & Plan Note (Signed)
SIDE EFFECTS DISCUSSED.

## 2012-11-13 NOTE — Progress Notes (Signed)
Called patient TO DISCUSS RESULTS. Explained CT is normal: NO PE/PNA. Continue ibuprofen/ice packs. Pain should improve over next 7 days.

## 2012-11-19 ENCOUNTER — Telehealth: Payer: Self-pay

## 2012-11-19 NOTE — Telephone Encounter (Signed)
Pt called to say that she is still having chest pain. She was seen last week and had a CT to rule out PE. That was negative. She said she took the Ibuprofen as directed and it did not do anything to help the pain. She is not having any shortness of breath. She said it just does not feel right in her chest and still hurts. She is aware that Dr. Darrick Penna has gone for the day, and it might be tomorrow before she hears back.

## 2012-11-20 NOTE — Telephone Encounter (Signed)
LMOM to call.

## 2012-11-20 NOTE — Telephone Encounter (Signed)
Called and LMOM to continue Nexium bid and the ibuprofen. CAll on Monday if she does not call before noon today.

## 2012-11-20 NOTE — Telephone Encounter (Signed)
PLEASE CALL PT. I think she has chest wall pain. She needs to continue taking Nexium bid and the ibuprofen. Her Sx should get better over the next 7 days. If not she should see her PCP.  Her Sx are not consistent with having a problem with her GI tract.

## 2012-11-23 NOTE — Telephone Encounter (Signed)
She has failed dexilant, protonix, omeprazole, & nexium.  I do not have anything else to offer at this point.  She should follow directions per Dr Darrick Penna.  Thanks

## 2012-11-23 NOTE — Telephone Encounter (Signed)
Pt said she has tried Dexilant and it caused her to have chest pain. She says the pain in chest comes when she eats and it feels like the food does not go all the way down. Please advise!

## 2012-11-23 NOTE — Telephone Encounter (Signed)
Pt returned call and was informed. She said now the Nexium is not working bid. She thinks maybe she needs to try something different. I told her Dr. Darrick Penna is off this week and I will send message to Lorenza Burton, NP who has seen her before. She uses the [facility] in [place]. She can be reached on her cell before lunch, after lunch her work number of [phone]. ( She is aware that if her chest wall pain continues she should contact PCP ). She said when she gets up now she has burning before she eats anything, and so she is not eating very much.

## 2012-11-23 NOTE — Telephone Encounter (Signed)
Called work number ( this is where pt said to call after noon) , line was busy.

## 2012-11-23 NOTE — Telephone Encounter (Signed)
Pt may pick up Dexilant 60mg  samples to take daily for 2 week trial She needs appt for chest pain with PCP. Thanks [employer]

## 2012-11-24 NOTE — Telephone Encounter (Signed)
LMOM on cell for a return call.

## 2012-11-24 NOTE — Telephone Encounter (Signed)
Pt called back and was in formed

## 2012-11-25 ENCOUNTER — Ambulatory Visit: Payer: Self-pay | Admitting: Internal Medicine

## 2012-11-27 NOTE — Progress Notes (Signed)
Pt is aware of OV on 5/21 at 3 with SF and appt card was mailed

## 2012-12-17 ENCOUNTER — Ambulatory Visit: Payer: Self-pay | Admitting: Gastroenterology

## 2012-12-18 LAB — PATHOLOGY REPORT

## 2013-01-27 ENCOUNTER — Ambulatory Visit: Payer: BC Managed Care – PPO | Admitting: Gastroenterology

## 2013-02-11 ENCOUNTER — Telehealth: Payer: Self-pay | Admitting: Gastroenterology

## 2013-02-11 ENCOUNTER — Ambulatory Visit: Payer: BC Managed Care – PPO | Admitting: Gastroenterology

## 2013-02-11 NOTE — Telephone Encounter (Signed)
REVIEWED.  PT WILL NEED TO CALL us IF SHE WOULD LIKE AN APPT.

## 2013-02-11 NOTE — Telephone Encounter (Signed)
Pt was a no show

## 2014-01-13 IMAGING — CT CT ANGIO CHEST
1 of 6 series · 5 of 36 positions shown · IV contrast (Omnipaque 300)
Comparison: No priors.

CLINICAL DATA: Chest pain shortness of breath.

CT ANGIOGRAPHY CHEST
TECHNIQUE: Multidetector CT imaging of the chest using the
standard protocol during bolus administration of intravenous
contrast. Multiplanar reconstructed images including MIPs were
obtained and reviewed to evaluate the vascular anatomy.
Contrast: 100mL OMNIPAQUE IOHEXOL 350 MG/ML SOLN

[Series 5: pe 3.0 b40f · axial · 0.59mm/px · z∈[-270,-78]mm · 5 of 97 slices shown]
[im 17/97  lung]
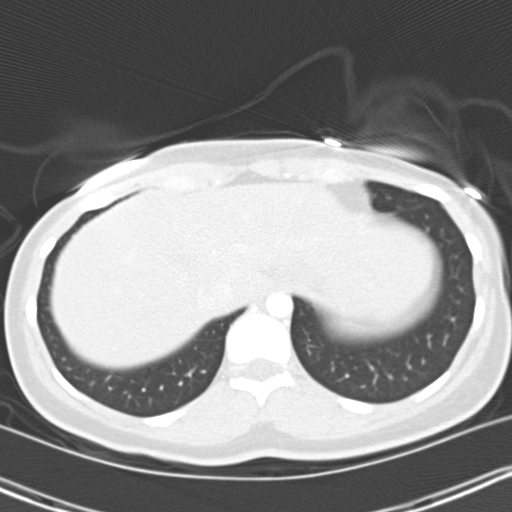
[im 33/97  mediastinal]
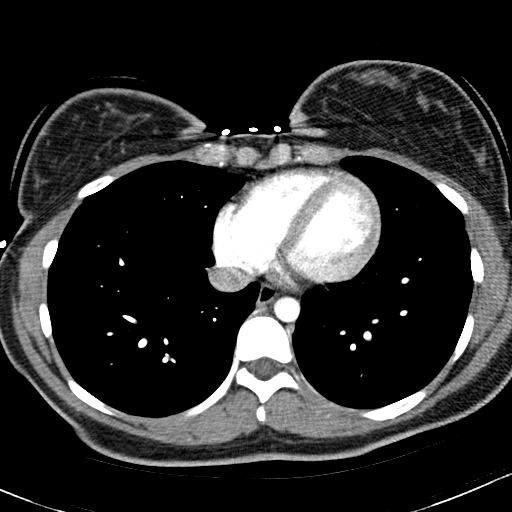
[im 49/97  lung]
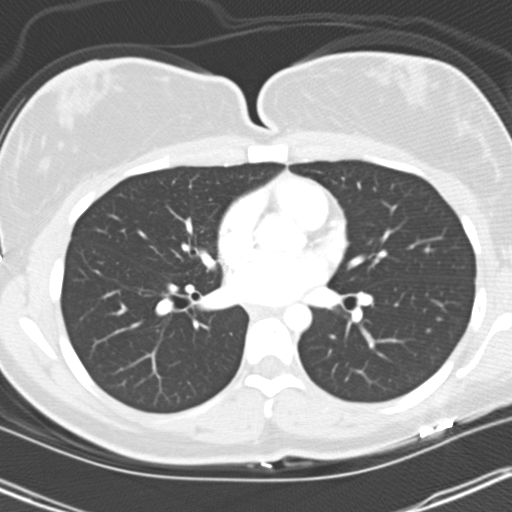
[im 65/97  mediastinal]
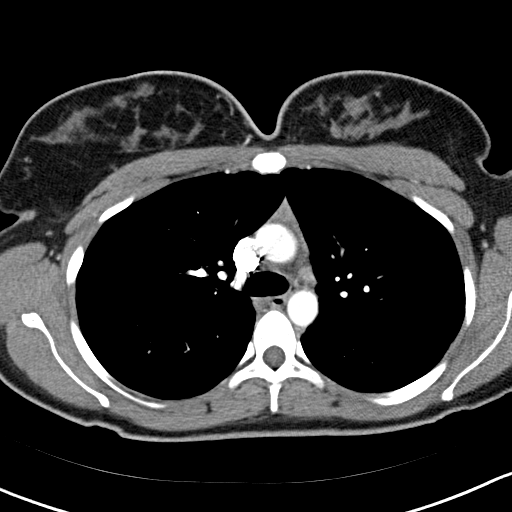
[im 81/97  lung]
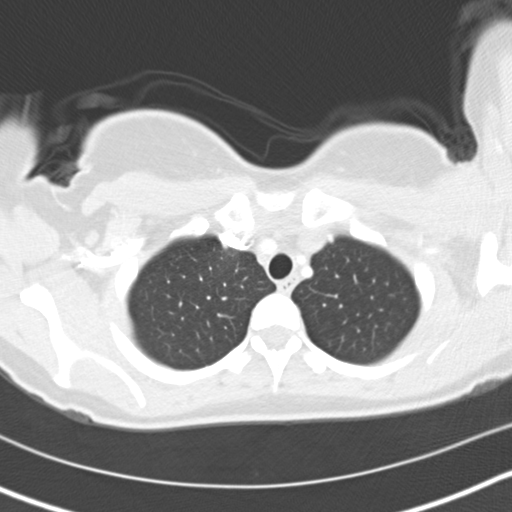

[5 of 36 positions shown; findings below may reference images not displayed]

FINDINGS: Mediastinum: There are no filling defects within the pulmonary
arterial tree to suggest underlying pulmonary embolism. Heart size
is normal. There is no significant pericardial fluid, thickening or
pericardial calcification. No pathologically enlarged mediastinal
or hilar lymph nodes.  Small amount of soft tissue in the anterior
mediastinum has imaging characteristics compatible with residual
thymic tissue in this young patient.  Esophagus is unremarkable in
appearance.

Lungs/Pleura: No acute consolidative airspace disease.  No pleural
effusions.  No suspicious appearing pulmonary nodules or masses are
identified.

Upper Abdomen: Unremarkable.

Musculoskeletal: There are no aggressive appearing lytic or blastic
lesions noted in the visualized portions of the skeleton.
IMPRESSION: 1.  No evidence of pulmonary embolism.
2.  No acute findings in the thorax to account for the patient's
symptoms.

## 2014-09-09 DIAGNOSIS — R87619 Unspecified abnormal cytological findings in specimens from cervix uteri: Secondary | ICD-10-CM

## 2014-09-09 HISTORY — DX: Unspecified abnormal cytological findings in specimens from cervix uteri: R87.619

## 2014-12-30 NOTE — Op Note (Signed)
PATIENT NAME:  Beth Peterson, [name] MR#:  [id number] DATE OF BIRTH:  1991/06/04  DATE OF PROCEDURE:  10/16/2012  PREOPERATIVE DIAGNOSIS: Pelvic pain and left ovarian cyst.  POSTOPERATIVE DIAGNOSIS: Pelvic pain and left ovarian cyst.  PROCEDURE: Laparoscopy with ovarian cystectomy on the left.  SURGEON: Dierdre Searles. Paul Iver Fehrenbach, MD  ANESTHESIA: General.  ESTIMATED BLOOD LOSS: Minimal.  COMPLICATIONS: None.  FINDINGS: Cystic left ovary, normal right ovary and uterus.   DISPOSITION: To the Recovery Room stable.  TECHNIQUE: The patient was prepped and draped in the usual sterile fashion. After adequate anesthesia is obtained in the dorsal lithotomy position, a Foley catheter was inserted. A speculum was placed and the cervix was identified and the Hulka tenaculum was placed.   Attention was then turned to the abdomen where a Veress needle was inserted through a 5-mm infraumbilical incision after Marcaine was infused to anesthetize the skin. The Veress needle placement is confirmed using hanging drop technique, and the abdomen is then insufflated with CO2 gas. A 5-mm trocar was then inserted under direct visualization with the laparoscope with no injuries or bleeding noted. The patient was placed in Trendelenburg position and the above-mentioned findings visualized.   Additional 5-mm trocars were placed in the right lower quadrant and suprapubic region directly under visualization with the laparoscope with no injuries or bleeding noted. The left ovarian cyst is grasped and carefully punctured and aspirated with a suction aspirator. The cyst wall was then carefully removed in several pieces using blunt laparoscopic instruments. Excellent hemostasis noted. The pelvic area was irrigated with saline with aspiration of all fluid with excellent hemostasis noted. No apparent injury to bowel, bladder, ureter or other structures was identified. The gas is expelled. The patient is leveled, the trocars are removed and  the skin is closed with Dermabond. The Hulka tenaculum and Foley catheter is removed. The patient goes to Recovery in stable condition. All sponge, instrument, and needle counts are correct.   ____________________________ R. Annamarie MajorPaul Argelio Granier, MD rph:jm D: 10/16/2012 11:47:00 ET T: 10/16/2012 13:23:43 ET JOB#: [id number]  cc: Dierdre Searles. Paul Mckaila Duffus, MD, <Dictator> Nadara MustardOBERT P Raevyn Sokol MD ELECTRONICALLY SIGNED 10/16/2012 14:06

## 2015-08-02 ENCOUNTER — Encounter: Payer: Self-pay | Admitting: *Deleted

## 2015-08-07 ENCOUNTER — Ambulatory Visit: Payer: 59 | Admitting: Anesthesiology

## 2015-08-07 ENCOUNTER — Ambulatory Visit
Admission: RE | Admit: 2015-08-07 | Discharge: 2015-08-07 | Disposition: A | Discharge status: Home / Self Care | Payer: 59 | Source: Ambulatory Visit | Attending: Gastroenterology | Admitting: Gastroenterology

## 2015-08-07 ENCOUNTER — Encounter
Admission: RE | Disposition: A | Discharge status: Home / Self Care | Payer: Self-pay | Source: Ambulatory Visit | Attending: Gastroenterology

## 2015-08-07 ENCOUNTER — Other Ambulatory Visit: Payer: Self-pay | Admitting: Gastroenterology

## 2015-08-07 ENCOUNTER — Encounter: Payer: Self-pay | Admitting: *Deleted

## 2015-08-07 DIAGNOSIS — K2960 Other gastritis without bleeding: Secondary | ICD-10-CM | POA: Insufficient documentation

## 2015-08-07 DIAGNOSIS — R1310 Dysphagia, unspecified: Secondary | ICD-10-CM | POA: Insufficient documentation

## 2015-08-07 DIAGNOSIS — Z6825 Body mass index (BMI) 25.0-25.9, adult: Secondary | ICD-10-CM | POA: Diagnosis not present

## 2015-08-07 DIAGNOSIS — Z888 Allergy status to other drugs, medicaments and biological substances status: Secondary | ICD-10-CM | POA: Diagnosis not present

## 2015-08-07 DIAGNOSIS — K449 Diaphragmatic hernia without obstruction or gangrene: Secondary | ICD-10-CM | POA: Diagnosis not present

## 2015-08-07 DIAGNOSIS — N83209 Unspecified ovarian cyst, unspecified side: Secondary | ICD-10-CM | POA: Insufficient documentation

## 2015-08-07 DIAGNOSIS — K219 Gastro-esophageal reflux disease without esophagitis: Secondary | ICD-10-CM | POA: Diagnosis present

## 2015-08-07 DIAGNOSIS — R1011 Right upper quadrant pain: Secondary | ICD-10-CM

## 2015-08-07 DIAGNOSIS — Z79899 Other long term (current) drug therapy: Secondary | ICD-10-CM | POA: Insufficient documentation

## 2015-08-07 DIAGNOSIS — Z7951 Long term (current) use of inhaled steroids: Secondary | ICD-10-CM | POA: Insufficient documentation

## 2015-08-07 DIAGNOSIS — E669 Obesity, unspecified: Secondary | ICD-10-CM | POA: Insufficient documentation

## 2015-08-07 HISTORY — PX: ESOPHAGOGASTRODUODENOSCOPY (EGD) WITH PROPOFOL: SHX5813

## 2015-08-07 HISTORY — DX: Dysphagia, unspecified: R13.10

## 2015-08-07 HISTORY — DX: Unspecified ovarian cyst, unspecified side: N83.209

## 2015-08-07 HISTORY — DX: Personal history of other diseases of the digestive system: Z87.19

## 2015-08-07 HISTORY — DX: Eosinophilic gastritis or gastroenteritis: K52.81

## 2015-08-07 HISTORY — DX: Chest pain, unspecified: R07.9

## 2015-08-07 LAB — POCT PREGNANCY, URINE: Preg Test, Ur: NEGATIVE

## 2015-08-07 SURGERY — ESOPHAGOGASTRODUODENOSCOPY (EGD) WITH PROPOFOL
Anesthesia: General

## 2015-08-07 MED ORDER — LACTATED RINGERS IV SOLN
INTRAVENOUS | Status: DC | PRN
  Administered 2015-08-07: 09:00:00 via INTRAVENOUS

## 2015-08-07 MED ORDER — SODIUM CHLORIDE 0.9 % IV SOLN: INTRAVENOUS | Status: DC

## 2015-08-07 MED ORDER — PROPOFOL 500 MG/50ML IV EMUL
INTRAVENOUS | Status: DC | PRN
  Administered 2015-08-07: 125 ug/kg/min via INTRAVENOUS

## 2015-08-07 MED ORDER — PROPOFOL 10 MG/ML IV BOLUS
INTRAVENOUS | Status: DC | PRN
  Administered 2015-08-07 (×2): 50 mg via INTRAVENOUS

## 2015-08-07 MED ORDER — SODIUM CHLORIDE 0.9 % IV SOLN
INTRAVENOUS | Status: DC
  Administered 2015-08-07: 1000 mL via INTRAVENOUS

## 2015-08-07 NOTE — Transfer of Care (Signed)
Immediate Anesthesia Transfer of Care Note  Patient: Beth Peterson  Procedure(s) Performed: Procedure(s): ESOPHAGOGASTRODUODENOSCOPY (EGD) WITH PROPOFOL (N/A)  Patient Location: PACU and Endoscopy Unit  Anesthesia Type:General  Level of Consciousness: awake, alert  and oriented  Airway & Oxygen Therapy: Patient Spontanous Breathing and Patient connected to nasal cannula oxygen  Post-op Assessment: Report given to RN and Post -op Vital signs reviewed and stable  Post vital signs: Reviewed and stable  Last Vitals:  Filed Vitals:   08/07/15 0815  BP: 121/78  Pulse: 66  Temp: 35.7 C  Resp: 16    Complications: No apparent anesthesia complications

## 2015-08-07 NOTE — Anesthesia Postprocedure Evaluation (Signed)
Anesthesia Post Note  Patient: Beth Peterson  Procedure(s) Performed: Procedure(s) (LRB): ESOPHAGOGASTRODUODENOSCOPY (EGD) WITH PROPOFOL (N/A)  Patient location during evaluation: PACU Anesthesia Type: General Level of consciousness: awake and alert Pain management: pain level controlled Vital Signs Assessment: post-procedure vital signs reviewed and stable Respiratory status: spontaneous breathing Cardiovascular status: stable Anesthetic complications: no    Last Vitals:  Filed Vitals:   08/07/15 0946 08/07/15 0956  BP: 97/62 115/76  Pulse: 78 61  Temp: 36 C   Resp: 17 18    Last Pain: There were no vitals filed for this visit.               VAN STAVEREN,Irene Collings

## 2015-08-07 NOTE — Anesthesia Preprocedure Evaluation (Signed)
Anesthesia Evaluation  Patient identified by MRN, date of birth, ID band Patient awake    Reviewed: Allergy & Precautions, NPO status , Patient's Chart, lab work & pertinent test results  Airway Mallampati: I       Dental  (+) Teeth Intact   Pulmonary neg pulmonary ROS,    breath sounds clear to auscultation       Cardiovascular negative cardio ROS   Rhythm:Regular Rate:Normal     Neuro/Psych    GI/Hepatic Neg liver ROS, hiatal hernia, GERD  ,  Endo/Other  negative endocrine ROS  Renal/GU negative Renal ROS     Musculoskeletal negative musculoskeletal ROS (+)   Abdominal (+) + obese,   Peds negative pediatric ROS (+)  Hematology negative hematology ROS (+)   Anesthesia Other Findings   Reproductive/Obstetrics                             Anesthesia Physical Anesthesia Plan  ASA: I  Anesthesia Plan: General   Post-op Pain Management:    Induction: Intravenous  Airway Management Planned: Nasal Cannula  Additional Equipment:   Intra-op Plan:   Post-operative Plan:   Informed Consent: I have reviewed the patients History and Physical, chart, labs and discussed the procedure including the risks, benefits and alternatives for the proposed anesthesia with the patient or authorized representative who has indicated his/her understanding and acceptance.     Plan Discussed with: CRNA  Anesthesia Plan Comments:         Anesthesia Quick Evaluation

## 2015-08-07 NOTE — Op Note (Signed)
[facility] Gastroenterology Patient Name: Beth Peterson Procedure Date: 08/07/2015 9:19 AM MRN: [id number] Account #: [id number] Date of Birth: 02/20/1991 Admit Type: Outpatient Age: 24 Room: [facility] ENDO ROOM 3 Gender: Female Note Status: Finalized Procedure:         Upper GI endoscopy Indications:       Gastro-esophageal reflux disease Providers:         Christena DeemMartin U. Syaire Saber, MD Referring MD:      Orie Fishermanosenow P. Copland Lorin GlassAlicia Pa, MD (Referring MD) Medicines:         Monitored Anesthesia Care Complications:     No immediate complications. Procedure:         Pre-Anesthesia Assessment:                    - ASA Grade Assessment: II - A patient with mild systemic                     disease.                    After obtaining informed consent, the endoscope was passed                     under direct vision. Throughout the procedure, the                     patient's blood pressure, pulse, and oxygen saturations                     were monitored continuously. The Endoscope was introduced                     through the mouth, and advanced to the fourth part of                     duodenum. The upper GI endoscopy was accomplished without                     difficulty. The patient tolerated the procedure well. Findings:      A small hiatus hernia was found. The Z-line was a variable distance from       incisors; the hiatal hernia was sliding.      Abnormal motility was noted in the middle third of the esophagus. The       cricopharyngeus was normal. There is spasticity of the esophageal body.       The distal esophagus/lower esophageal sphincter is open. Tertiary       peristaltic waves are noted. There was no evidence of furrowing or       corrugation.      The Z-line was variable. Biopsies were taken with a cold forceps for       histology.      Diffuse minimal inflammation characterized by congestion (edema) and       erythema was found in the gastric body.  Biopsies were taken with a cold       forceps for histology.      The examined duodenum was normal. Biopsies were taken with a cold       forceps for histology. Impression:        - Small hiatus hernia.                    - The examination was suspicious for esophageal spasm.                    -  Z-line variable. Biopsied.                    - Bile gastritis. Biopsied.                    - Normal examined duodenum. Biopsied. Recommendation:    - Perform a hepatobiliary scan at appointment to be                     scheduled.                    - Perform an abdominal ultrasound at appointment to be                     scheduled. Procedure Code(s): --- Professional ---                    [phone], Esophagogastroduodenoscopy, flexible, transoral;                     with biopsy, single or multiple Diagnosis Code(s): --- Professional ---                    K44.9, Diaphragmatic hernia without obstruction or gangrene                    K22.8, Other specified diseases of esophagus                    K29.60, Other gastritis without bleeding                    K21.9, Gastro-esophageal reflux disease without esophagitis CPT copyright 2014 American Medical Association. All rights reserved. The codes documented in this report are preliminary and upon coder review may  be revised to meet current compliance requirements. Christena Deem, MD 08/07/2015 9:48:18 AM This report has been signed electronically. Number of Addenda: 0 Note Initiated On: 08/07/2015 9:19 AM      [facility]

## 2015-08-07 NOTE — H&P (Signed)
Outpatient short stay form Pre-procedure 08/07/2015 9:15 AM Christena DeemMartin U Shatori Bertucci MD  Primary Physician: Levin ErpAlicia Copeland, [facility]  Reason for visit:  Epigastric burning, refractory GERD  History of present illness:  Patient is a 24 year old female with a known history of gas after reflux. She has had esophageal manometry in the past which was normal and showed a small hiatal hernia. She has some evidence of eosinophilic duodenitis on a scale [facility]. However [facility] esophagus showed no evidence of eosinophilic esophagitis. He has been taking Protonix however in discussion with her this is not been well timed to maximize effect. There is intermittent epigastric abdominal discomfort. There is no dysphagia.    Current facility-administered medications:  .  0.9 %  sodium chloride infusion, , Intravenous, Continuous, Christena DeemMartin U Reymond Maynez, MD .  0.9 %  sodium chloride infusion, , Intravenous, Continuous, Christena DeemMartin U Dao Mearns, MD, Last Rate: 20 mL/hr at 08/07/15 0833, 1,000 mL at 08/07/15 [id number]  Prescriptions prior to admission  Medication Sig Dispense Refill Last Dose  . albuterol (PROVENTIL HFA;VENTOLIN HFA) 108 (90 BASE) MCG/ACT inhaler Inhale 2 puffs into the lungs every 6 (six) hours as needed for wheezing or shortness of breath.     . Ciclopirox 1 % shampoo Apply topically 2 (two) times a week.     . ciprofloxacin (CIPRO) 500 MG tablet Take 500 mg by mouth 2 (two) times daily.     Marland Kitchen. levonorgestrel (MIRENA) 20 MCG/24HR IUD 1 each by Intrauterine route once.     . ondansetron (ZOFRAN) 4 MG tablet Take 4 mg by mouth every 8 (eight) hours as needed for nausea or vomiting.     . pantoprazole (PROTONIX) 40 MG tablet Take 40 mg by mouth daily.     Marland Kitchen. amitriptyline (ELAVIL) 25 MG tablet Take 1 tablet by mouth At bedtime.   Not Taking at Unknown time  . esomeprazole (NEXIUM) 40 MG capsule 1 PO 30 MINS PRIOR TO MEALS BID FOR 2 WEEKS THE ONCE DAILY (Patient not taking:  Reported on 08/07/2015) 62 capsule 11 Not Taking at Unknown time  . ibuprofen (ADVIL,MOTRIN) 800 MG tablet 1 PO Q6H FOR 3 DAYS THE 1 PO TID FOR 7 DAYS 35 tablet 0   . Linaclotide (LINZESS) 290 MCG CAPS Take 1 capsule by mouth daily. 31 capsule 11      Allergies  Allergen Reactions  . Dexilant [Dexlansoprazole] Other (See Comments)    Chest pain-No help for GERD Fine on PANTOPRAZOLE previously     Past Medical History  Diagnosis Date  . GERD (gastroesophageal reflux disease)   . Constipation   . History of hiatal hernia   . Ovarian cyst   . Chest pain   . Dysphagia   . Eosinophilic gastroenteritis     Review of systems:      Physical Exam    Heart and lungs: Regular rate and rhythm without rub or gallop, lungs are bilaterally clear    HEENT: Norm cephalic atraumatic eyes are anicteric    Other:     Pertinant exam for procedure: Soft nontender nondistended bowel sounds positive normoactive.    Planned proceedures: EGD and indicated procedures. I have discussed the risks benefits and complications of procedures to include not limited to bleeding, infection, perforation and the risk of sedation and the patient wishes to proceed.    Christena DeemMartin U Svea Pusch, MD Gastroenterology 08/07/2015  9:15 AM

## 2015-08-08 LAB — SURGICAL PATHOLOGY

## 2015-08-09 ENCOUNTER — Encounter: Payer: Self-pay | Admitting: Gastroenterology

## 2015-08-16 ENCOUNTER — Ambulatory Visit: Payer: 59

## 2015-10-10 LAB — HM PAP SMEAR: HM Pap smear: NEGATIVE

## 2017-01-16 ENCOUNTER — Encounter: Payer: Self-pay | Admitting: Obstetrics and Gynecology

## 2017-01-21 ENCOUNTER — Encounter: Payer: Self-pay | Admitting: Obstetrics and Gynecology

## 2017-01-21 ENCOUNTER — Ambulatory Visit (INDEPENDENT_AMBULATORY_CARE_PROVIDER_SITE_OTHER): Payer: Managed Care, Other (non HMO) | Admitting: Obstetrics and Gynecology

## 2017-01-21 VITALS — BP 100/52 | HR 74 | Ht 64.0 in | Wt 145.0 lb

## 2017-01-21 DIAGNOSIS — Z124 Encounter for screening for malignant neoplasm of cervix: Secondary | ICD-10-CM

## 2017-01-21 DIAGNOSIS — K5901 Slow transit constipation: Secondary | ICD-10-CM | POA: Diagnosis not present

## 2017-01-21 DIAGNOSIS — Z30431 Encounter for routine checking of intrauterine contraceptive device: Secondary | ICD-10-CM

## 2017-01-21 DIAGNOSIS — Z01419 Encounter for gynecological examination (general) (routine) without abnormal findings: Secondary | ICD-10-CM | POA: Diagnosis not present

## 2017-01-21 DIAGNOSIS — Z803 Family history of malignant neoplasm of breast: Secondary | ICD-10-CM | POA: Diagnosis not present

## 2017-01-21 DIAGNOSIS — R300 Dysuria: Secondary | ICD-10-CM | POA: Diagnosis not present

## 2017-01-21 DIAGNOSIS — Z113 Encounter for screening for infections with a predominantly sexual mode of transmission: Secondary | ICD-10-CM

## 2017-01-21 LAB — POCT URINALYSIS DIPSTICK
Bilirubin, UA: NEGATIVE
Blood, UA: NEGATIVE
GLUCOSE UA: NEGATIVE
Leukocytes, UA: NEGATIVE
NITRITE UA: NEGATIVE
Protein, UA: NEGATIVE
Spec Grav, UA: 1.01 (ref 1.010–1.025)
UROBILINOGEN UA: NEGATIVE U/dL — AB
pH, UA: 6.5 (ref 5.0–8.0)

## 2017-01-21 MED ORDER — LUBIPROSTONE 8 MCG PO CAPS: 8.0000 ug | ORAL_CAPSULE | Freq: Two times a day (BID) | ORAL | 2 refills | Status: DC

## 2017-01-21 NOTE — Progress Notes (Addendum)
Chief Complaint  Patient presents with  . Gynecologic Exam    STD check  . Urinary Tract Infection    Burning w/Urination/discharge     HPI:      [name] is a 26 y.o. No obstetric history on file. who LMP was No LMP recorded. Patient is not currently having periods (Reason: IUD)., presents today for her annual examination.  Her menses are absent with the IUD. Dysmenorrhea none. She does have intermenstrual bleeding.  Sex activity: single partner, contraception - IUD. Mirena placed 10/13. Last Pap: October 10, 2015  Results were: no abnormalities  Hx of STDs: none. She would like full STD testing.   There is a FH of breast cancer in her mat aunt and MGM, genetic testing not done. Pt unsure of age of dx of MGM. There is no FH of ovarian cancer. The patient does not do self-breast exams.  Tobacco use: The patient denies current or previous tobacco use. Alcohol use: social drinker Exercise: not active  She does not get adequate calcium and Vitamin D in her diet.  She has a hx of frequent UTIs and has seen urology. She complains of dysuria sx recently but has been drinking lots of soda. That is usually a trigger for pt. She denies any urgency, frequency sx, no vag sx.   She continues to have constipation probs. She tried increased fiber/colace last yr with minimal relief. She has tried miralax, too. She is very frustrated. She had a Rx constipation med that worked but Product/process development scientist cover it.   Past Medical History:  Diagnosis Date  . Abnormal Pap smear of cervix 2016  . Chest pain   . Constipation   . Dysphagia   . Eosinophilic gastroenteritis   . GERD (gastroesophageal reflux disease)   . History of hiatal hernia   . Ovarian cyst   . UTI (urinary tract infection)     Past Surgical History:  Procedure Laterality Date  . APPENDECTOMY  2012   [facility] NWGNFA  . ESOPHAGOGASTRODUODENOSCOPY  04/02/12   Fields-proximal esophageal web, small hiatal hernia, mild  non-H. pylori gastritis, 12.8-16mm savory dilation  . ESOPHAGOGASTRODUODENOSCOPY (EGD) WITH PROPOFOL N/A 08/07/2015   Procedure: ESOPHAGOGASTRODUODENOSCOPY (EGD) WITH PROPOFOL;  Surgeon: Christena Deem, MD;  Location: [facility] ENDOSCOPY;  Service: Endoscopy;  Laterality: N/A;  . INTRAUTERINE DEVICE (IUD) INSERTION    . ovarian cyst removed    . uterus tacked  2012    Family History  Problem Relation Age of Onset  . Hypertension Mother   . Breast cancer Maternal Grandmother        ? age of dx  . Breast cancer Maternal Aunt 74    Social History   Social History  . Marital status: Single    Spouse name: N/A  . Number of children: 0  . Years of education: N/A   Occupational History  .  [employer]   Social History Main Topics  . Smoking status: Never Smoker  . Smokeless tobacco: Never Used  . Alcohol use Yes     Comment: RARE  . Drug use: No  . Sexual activity: Yes    Birth control/ protection: IUD   Other Topics Concern  . Not on file   Social History Narrative   Lives w/ mother     Current Outpatient Prescriptions:  .  levonorgestrel (MIRENA) 20 MCG/24HR IUD, 1 each by Intrauterine route once., Disp: , Rfl:  .  lubiprostone (AMITIZA) 8 MCG capsule, Take  1 capsule (8 mcg total) by mouth 2 (two) times daily with a meal., Disp: 60 capsule, Rfl: 2  ROS:  Review of Systems  Constitutional: Negative for fatigue, fever and unexpected weight change.  Respiratory: Negative for cough, shortness of breath and wheezing.   Cardiovascular: Negative for chest pain, palpitations and leg swelling.  Gastrointestinal: Positive for constipation. Negative for blood in stool, diarrhea, nausea and vomiting.  Endocrine: Negative for cold intolerance, heat intolerance and polyuria.  Genitourinary: Positive for dysuria and vaginal bleeding. Negative for dyspareunia, flank pain, frequency, genital sores, hematuria, menstrual problem, pelvic pain, urgency, vaginal discharge and vaginal  pain.  Musculoskeletal: Negative for back pain, joint swelling and myalgias.  Skin: Negative for rash.  Neurological: Negative for dizziness, syncope, light-headedness, numbness and headaches.  Hematological: Negative for adenopathy.  Psychiatric/Behavioral: Negative for agitation, confusion, sleep disturbance and suicidal ideas. The patient is not nervous/anxious.      Objective: BP (!) 100/52 (BP Location: Left Arm, Patient Position: Sitting, Cuff Size: Normal)   Pulse 74   Ht 5\' 4"  (1.626 m)   Wt 145 lb (65.8 kg)   BMI 24.89 kg/m    Physical Exam  Constitutional: She is oriented to person, place, and time. She appears well-developed and well-nourished.  Genitourinary: Vagina normal and uterus normal. There is no rash or tenderness on the right labia. There is no rash or tenderness on the left labia. No erythema or tenderness in the vagina. No vaginal discharge found. Right adnexum does not display mass and does not display tenderness. Left adnexum does not display mass and does not display tenderness.  Cervix exhibits visible IUD strings. Cervix does not exhibit motion tenderness or polyp. Uterus is not enlarged or tender.  Neck: Normal range of motion. No thyromegaly present.  Cardiovascular: Normal rate, regular rhythm and normal heart sounds.   No murmur heard. Pulmonary/Chest: Effort normal and breath sounds normal. Right breast exhibits no mass, no nipple discharge, no skin change and no tenderness. Left breast exhibits no mass, no nipple discharge, no skin change and no tenderness.  Abdominal: Soft. There is no tenderness. There is no guarding.  Musculoskeletal: Normal range of motion.  Neurological: She is alert and oriented to person, place, and time. No cranial nerve deficit.  Psychiatric: She has a normal mood and affect. Her behavior is normal.  Vitals reviewed.   Assessment/Plan: Encounter for annual routine gynecological examination  Cervical cancer screening -  Plan: IGP,CtNgTv,rfx Aptima HPV ASCU  Screening for STD (sexually transmitted disease) - Plan: IGP,CtNgTv,rfx Aptima HPV ASCU, HIV antibody, RPR, HSV 2 antibody, IgG, Hepatitis C antibody  Encounter for routine checking of intrauterine contraceptive device (IUD) - IUD in place. Due for rem 10/18. Pt to consider replacement vs other BC. Discussed cytotec/NSAIDs if getting another IUD. F/u 10/18.  Burning with urination - Neg dip. Check C&S. D/C soda to see if sx improve. F/u prn.  - Plan: POCT urinalysis dipstick, Urine culture  Slow transit constipation - Try amitiza. Rx eRxd. F/u prn. May need ref to GI if sx persist. - Plan: lubiprostone (AMITIZA) 8 MCG capsule  Family history of breast cancer - My Risk testing discussed. Pt to find out age of dx of MGM. If </=50, pt qualifies for genetic testing. F/u prn.             GYN counsel family planning choices, adequate intake of calcium and vitamin D, diet and exercise     F/U  Return in about 1 year (around  01/21/2018) for annual.  Helmut MusterAlicia B. Brenyn Petrey, PA-C 01/21/2017 11:03 AM

## 2017-01-22 LAB — HIV ANTIBODY (ROUTINE TESTING W REFLEX): HIV Screen 4th Generation wRfx: NONREACTIVE

## 2017-01-22 LAB — HSV 2 ANTIBODY, IGG: HSV 2 Glycoprotein G Ab, IgG: <0.91 index (ref 0.00–0.90)

## 2017-01-22 LAB — HEPATITIS C ANTIBODY

## 2017-01-22 LAB — RPR: RPR Ser Ql: NONREACTIVE

## 2017-01-23 LAB — IGP,CTNGTV,RFX APTIMA HPV ASCU
CHLAMYDIA, NUC. ACID AMP: NEGATIVE
Gonococcus, Nuc. Acid Amp: NEGATIVE
PAP SMEAR COMMENT: 0
Trich vag by NAA: NEGATIVE

## 2017-01-23 LAB — URINE CULTURE

## 2017-06-11 ENCOUNTER — Ambulatory Visit (INDEPENDENT_AMBULATORY_CARE_PROVIDER_SITE_OTHER): Payer: Managed Care, Other (non HMO) | Admitting: Obstetrics and Gynecology

## 2017-06-11 VITALS — BP 100/70 | HR 66 | Ht 64.0 in | Wt 155.0 lb

## 2017-06-11 DIAGNOSIS — Z30431 Encounter for routine checking of intrauterine contraceptive device: Secondary | ICD-10-CM | POA: Diagnosis not present

## 2017-06-11 DIAGNOSIS — N760 Acute vaginitis: Secondary | ICD-10-CM

## 2017-06-11 DIAGNOSIS — B9689 Other specified bacterial agents as the cause of diseases classified elsewhere: Secondary | ICD-10-CM | POA: Diagnosis not present

## 2017-06-11 LAB — POCT WET PREP WITH KOH
CLUE CELLS WET PREP PER HPF POC: POSITIVE
KOH Prep POC: POSITIVE — AB
TRICHOMONAS UA: NEGATIVE
YEAST WET PREP PER HPF POC: NEGATIVE

## 2017-06-11 MED ORDER — METRONIDAZOLE 500 MG PO TABS: ORAL_TABLET | ORAL | 0 refills | Status: DC

## 2017-06-11 NOTE — Progress Notes (Signed)
Chief Complaint  Patient presents with  . Gynecologic Exam    vag inf    HPI:      [name] is a 26 y.o. G0P0000 who LMP was No LMP recorded (lmp unknown). Patient is not currently having periods (Reason: IUD)., presents today for vaginal d/c with odor, no irritation, for several days. No itch. No LBP, fevers. She is sex active, no new partners. No meds to treat, no recent abx use. Hx of BV 3/17.  She is due for IUD removal 10/18. Pt considering replacement IUD vs depo.    Past Medical History:  Diagnosis Date  . Abnormal Pap smear of cervix 2016  . Chest pain   . Constipation   . Dysphagia   . Eosinophilic gastroenteritis   . GERD (gastroesophageal reflux disease)   . History of hiatal hernia   . Ovarian cyst   . UTI (urinary tract infection)     Past Surgical History:  Procedure Laterality Date  . APPENDECTOMY  2012   [facility] QIONGE  . ESOPHAGOGASTRODUODENOSCOPY  04/02/12   Fields-proximal esophageal web, small hiatal hernia, mild non-H. pylori gastritis, 12.8-16mm savory dilation  . ESOPHAGOGASTRODUODENOSCOPY (EGD) WITH PROPOFOL N/A 08/07/2015   Procedure: ESOPHAGOGASTRODUODENOSCOPY (EGD) WITH PROPOFOL;  Surgeon: Christena Deem, MD;  Location: [facility] ENDOSCOPY;  Service: Endoscopy;  Laterality: N/A;  . INTRAUTERINE DEVICE (IUD) INSERTION    . ovarian cyst removed    . uterus tacked  2012    Family History  Problem Relation Age of Onset  . Hypertension Mother   . Breast cancer Maternal Grandmother        ? age of dx  . Breast cancer Maternal Aunt 71    Social History   Social History  . Marital status: Single    Spouse name: N/A  . Number of children: 0  . Years of education: N/A   Occupational History  .  [employer]   Social History Main Topics  . Smoking status: Never Smoker  . Smokeless tobacco: Never Used  . Alcohol use Yes     Comment: RARE  . Drug use: No  . Sexual activity: Yes    Birth control/ protection: IUD   Other  Topics Concern  . Not on file   Social History Narrative   Lives w/ mother     Current Outpatient Prescriptions:  .  levonorgestrel (MIRENA) 20 MCG/24HR IUD, 1 each by Intrauterine route once., Disp: , Rfl:  .  lubiprostone (AMITIZA) 8 MCG capsule, Take 1 capsule (8 mcg total) by mouth 2 (two) times daily with a meal., Disp: 60 capsule, Rfl: 2 .  metroNIDAZOLE (FLAGYL) 500 MG tablet, Take 2 tabs BID for 1 day, Disp: 4 tablet, Rfl: 0   ROS:  Review of Systems  Constitutional: Negative for fever.  Gastrointestinal: Positive for constipation. Negative for blood in stool, diarrhea, nausea and vomiting.  Genitourinary: Positive for vaginal discharge. Negative for dyspareunia, dysuria, flank pain, frequency, hematuria, urgency, vaginal bleeding and vaginal pain.  Musculoskeletal: Negative for back pain.  Skin: Negative for rash.     OBJECTIVE:   Vitals:  BP 100/70   Pulse 66   Ht  (1.626 m)   Wt 155 lb (70.3 kg)   LMP  (LMP Unknown) Comment: no menses c IUD  BMI 26.61 kg/m   Physical Exam  Constitutional: She is oriented to person, place, and time and well-developed, well-nourished, and in no distress. Vital signs are normal.  Genitourinary: Vagina  normal, uterus normal, cervix normal, right adnexa normal, left adnexa normal and vulva normal. Uterus is not enlarged. Cervix exhibits no motion tenderness and no tenderness. Right adnexum displays no mass and no tenderness. Left adnexum displays no mass and no tenderness. Vulva exhibits no erythema, no exudate, no lesion, no rash and no tenderness. Vagina exhibits no lesion.  Neurological: She is oriented to person, place, and time.  Vitals reviewed.   Results: Results for orders placed or performed in visit on 06/11/17 (from the past 24 hour(s))  POCT Wet Prep with KOH     Status: Abnormal   Collection Time: 06/11/17  9:19 AM  Result Value Ref Range   Trichomonas, UA Negative    Clue Cells Wet Prep HPF POC pos     Epithelial Wet Prep HPF POC  Few, Moderate, Many, Too numerous to count   Yeast Wet Prep HPF POC neg    Bacteria Wet Prep HPF POC  Few   RBC Wet Prep HPF POC     WBC Wet Prep HPF POC     KOH Prep POC Positive (A) Negative     Assessment/Plan: Bacterial vaginosis - Pos wet prep. Rx flagyl. No EtOH. Will RF if sx recur.  - Plan: metroNIDAZOLE (FLAGYL) 500 MG tablet, POCT Wet Prep with KOH  Encounter for routine checking of intrauterine contraceptive device (IUD) - Pt due for IUD removal 10/18. Will sched removal appt. May want replacement vs depo. F/u prn.     Meds ordered this encounter  Medications  . metroNIDAZOLE (FLAGYL) 500 MG tablet    Sig: Take 2 tabs BID for 1 day    Dispense:  4 tablet    Refill:  0      Return in about 1 week (around 06/18/2017) for IUD removal/insertion.  Dannya Pitkin B. Pookela Sellin, PA-C 06/11/2017 9:20 AM

## 2017-06-16 ENCOUNTER — Encounter: Payer: Self-pay | Admitting: Obstetrics and Gynecology

## 2017-06-16 ENCOUNTER — Other Ambulatory Visit: Payer: Self-pay | Admitting: Obstetrics and Gynecology

## 2017-06-16 DIAGNOSIS — N760 Acute vaginitis: Principal | ICD-10-CM

## 2017-06-16 DIAGNOSIS — B9689 Other specified bacterial agents as the cause of diseases classified elsewhere: Secondary | ICD-10-CM

## 2017-06-16 MED ORDER — METRONIDAZOLE 500 MG PO TABS: ORAL_TABLET | ORAL | 0 refills | Status: DC

## 2017-06-16 NOTE — Progress Notes (Signed)
Rx RF flagyl due to recurrent sx.

## 2017-07-24 ENCOUNTER — Encounter: Payer: Self-pay | Admitting: Obstetrics and Gynecology

## 2017-07-24 ENCOUNTER — Other Ambulatory Visit: Payer: Self-pay | Admitting: Obstetrics and Gynecology

## 2017-07-24 MED ORDER — MISOPROSTOL 100 MCG PO TABS: 100.0000 ug | ORAL_TABLET | Freq: Once | ORAL | 0 refills | Status: DC

## 2017-07-24 NOTE — Progress Notes (Signed)
Rx cytotec to take 1 hr before IUD insertion appt.

## 2017-07-25 ENCOUNTER — Other Ambulatory Visit: Payer: Self-pay | Admitting: Obstetrics and Gynecology

## 2017-07-25 MED ORDER — CLINDAMYCIN PHOSPHATE 2 % VA CREA: 1.0000 | TOPICAL_CREAM | Freq: Every day | VAGINAL | 0 refills | Status: DC

## 2017-07-25 NOTE — Progress Notes (Signed)
Rx cleocin eRxd due to recurrent BV sx.

## 2017-08-04 ENCOUNTER — Telehealth: Payer: Self-pay | Admitting: Obstetrics and Gynecology

## 2017-08-04 ENCOUNTER — Other Ambulatory Visit: Payer: Self-pay | Admitting: Obstetrics and Gynecology

## 2017-08-04 MED ORDER — CLINDAMYCIN PHOSPHATE 2 % VA CREA: 1.0000 | TOPICAL_CREAM | Freq: Every day | VAGINAL | 0 refills | Status: DC

## 2017-08-04 NOTE — Telephone Encounter (Signed)
Pt is schedule with [facility] on 08/06/17 for Mirena removal and reinsertion

## 2017-08-06 ENCOUNTER — Encounter: Payer: Self-pay | Admitting: Obstetrics and Gynecology

## 2017-08-06 ENCOUNTER — Ambulatory Visit: Payer: Managed Care, Other (non HMO) | Admitting: Obstetrics and Gynecology

## 2017-08-06 ENCOUNTER — Other Ambulatory Visit: Payer: Self-pay

## 2017-08-06 VITALS — BP 110/64 | HR 80 | Ht 64.0 in | Wt 157.0 lb

## 2017-08-06 DIAGNOSIS — Z30433 Encounter for removal and reinsertion of intrauterine contraceptive device: Secondary | ICD-10-CM | POA: Diagnosis not present

## 2017-08-06 DIAGNOSIS — N760 Acute vaginitis: Secondary | ICD-10-CM

## 2017-08-06 DIAGNOSIS — B9689 Other specified bacterial agents as the cause of diseases classified elsewhere: Secondary | ICD-10-CM | POA: Diagnosis not present

## 2017-08-06 DIAGNOSIS — N3001 Acute cystitis with hematuria: Secondary | ICD-10-CM

## 2017-08-06 LAB — POCT URINALYSIS DIPSTICK
Bilirubin, UA: NEGATIVE
Glucose, UA: NEGATIVE
KETONES UA: NEGATIVE
Nitrite, UA: POSITIVE
PH UA: 6 (ref 5.0–8.0)
PROTEIN UA: NEGATIVE
SPEC GRAV UA: 1.025 (ref 1.010–1.025)

## 2017-08-06 LAB — POCT WET PREP WITH KOH
CLUE CELLS WET PREP PER HPF POC: POSITIVE
KOH PREP POC: POSITIVE — AB
Trichomonas, UA: NEGATIVE
Yeast Wet Prep HPF POC: NEGATIVE

## 2017-08-06 MED ORDER — NITROFURANTOIN MONOHYD MACRO 100 MG PO CAPS: 100.0000 mg | ORAL_CAPSULE | Freq: Two times a day (BID) | ORAL | 0 refills | Status: AC

## 2017-08-06 MED ORDER — LEVONORGESTREL 20 MCG/24HR IU IUD: 1.0000 | INTRAUTERINE_SYSTEM | Freq: Once | INTRAUTERINE | Status: DC

## 2017-08-06 NOTE — Patient Instructions (Addendum)
I value your feedback and entrusting us with your care. If you get a [facility] patient survey, I would appreciate you taking the time to let us know about your experience today. Thank you!  [facility] [phone]  Instructions after IUD insertion  Most women experience no significant problems after insertion of an IUD, however minor cramping and spotting for a few days is common. Cramps may be treated with ibuprofen 800mg every 8 hours or Tylenol 650 mg every 4 hours. Contact [facility] immediately if you experience any of the following symptoms during the next week: temperature >99.6 degrees, worsening pelvic pain, abdominal pain, fainting, unusually heavy vaginal bleeding, foul vaginal discharge, or if you think you have expelled the IUD.  Nothing inserted in the vagina for 48 hours. You will be scheduled for a follow up visit in approximately four weeks.  You should check monthly to be sure you can feel the IUD strings in the upper vagina. If you are having a monthly period, try to check after each period. If you cannot feel the IUD strings,  contact [facility] immediately so we can do an exam to determine if the IUD has been expelled.   Please use backup protection until we can confirm the IUD is in place.  Call [facility] if you are exposed to or diagnosed with a sexually transmitted infection, as we will need to discuss whether it is safe for you to continue using an IUD.   

## 2017-08-06 NOTE — Progress Notes (Signed)
Chief Complaint  Patient presents with  . Contraception    IUD Removal & Replacement  . Follow-up    BV and urinary sx    HPI:      [name] is a 26 y.o. G0P0000 who LMP was No LMP recorded. Patient is not currently having periods (Reason: IUD)., presents today for BV sx and UTI sx. She was treated twice with flagyl for BV since 10/18 (once with me and then once at urgent care). I then send in Rx for cleocin crm 11/18 due to recurrent sx. Pt has treated 3 nights with it so far. Increased vag d/c resolved but pt still has fishy odor, no itching. She is sex active, no new partners. She is frustrated by recurrent sx.  She also notes dysuria, urin frequency and urgency with incont for the past 4-5 days. No LBP, belly pain, fevers. She had a UTI many yrs ago, but no recent sx.    Past Medical History:  Diagnosis Date  . Abnormal Pap smear of cervix 2016  . Chest pain   . Constipation   . Dysphagia   . Eosinophilic gastroenteritis   . GERD (gastroesophageal reflux disease)   . History of hiatal hernia   . Ovarian cyst   . UTI (urinary tract infection)     Past Surgical History:  Procedure Laterality Date  . APPENDECTOMY  2012   [id number]  . ESOPHAGOGASTRODUODENOSCOPY  04/02/12   Fields-proximal esophageal web, small hiatal hernia, mild non-H. pylori gastritis, 12.8-16mm savory dilation  . ESOPHAGOGASTRODUODENOSCOPY (EGD) WITH PROPOFOL N/A 08/07/2015   Procedure: ESOPHAGOGASTRODUODENOSCOPY (EGD) WITH PROPOFOL;  Surgeon: Christena Deem, MD;  Location: [facility] ENDOSCOPY;  Service: Endoscopy;  Laterality: N/A;  . INTRAUTERINE DEVICE (IUD) INSERTION    . ovarian cyst removed    . uterus tacked  2012    Family History  Problem Relation Age of Onset  . Hypertension Mother   . Breast cancer Maternal Grandmother        ? age of dx  . Breast cancer Maternal Aunt 21    Social History   Socioeconomic History  . Marital status: Single    Spouse name: Not on  file  . Number of children: 0  . Years of education: Not on file  . Highest education level: Not on file  Social Needs  . Financial resource strain: Not on file  . Food insecurity - worry: Not on file  . Food insecurity - inability: Not on file  . Transportation needs - medical: Not on file  . Transportation needs - non-medical: Not on file  Occupational History    Employer: [employer]  Tobacco Use  . Smoking status: Never Smoker  . Smokeless tobacco: Never Used  Substance and Sexual Activity  . Alcohol use: Yes    Comment: RARE  . Drug use: No  . Sexual activity: Yes    Birth control/protection: IUD  Other Topics Concern  . Not on file  Social History Narrative   Lives w/ mother     Current Outpatient Medications:  .  clindamycin (CLEOCIN) 2 % vaginal cream, Place 1 Applicatorful vaginally at bedtime., Disp: 40 g, Rfl: 0 .  levonorgestrel (MIRENA) 20 MCG/24HR IUD, 1 Intra Uterine Device (1 each total) by Intrauterine route once for 1 dose., Disp: 1 each, Rfl:  .  lubiprostone (AMITIZA) 8 MCG capsule, Take 1 capsule (8 mcg total) by mouth 2 (two) times daily with a meal., Disp:  60 capsule, Rfl: 2 .  metroNIDAZOLE (FLAGYL) 500 MG tablet, Take 1 tab BID for 7 days (Patient not taking: Reported on 08/06/2017), Disp: 14 tablet, Rfl: 0 .  misoprostol (CYTOTEC) 100 MCG tablet, Take 1 tablet (100 mcg total) once for 1 dose by mouth. 1 hour before appt, Disp: 1 tablet, Rfl: 0 .  nitrofurantoin, macrocrystal-monohydrate, (MACROBID) 100 MG capsule, Take 1 capsule (100 mg total) by mouth 2 (two) times daily for 5 days., Disp: 10 capsule, Rfl: 0   ROS:  Review of Systems  Constitutional: Negative for fever.  Gastrointestinal: Negative for blood in stool, constipation, diarrhea, nausea and vomiting.  Genitourinary: Positive for dysuria, frequency, urgency and vaginal discharge. Negative for dyspareunia, flank pain, hematuria, vaginal bleeding and vaginal pain.  Musculoskeletal:  Negative for back pain.  Skin: Negative for rash.     OBJECTIVE:   Vitals:  BP 110/64 (BP Location: Left Arm, Patient Position: Sitting, Cuff Size: Normal)   Pulse 80   Ht 5\' 4"  (1.626 m)   Wt 157 lb (71.2 kg)   BMI 26.95 kg/m   Physical Exam  Constitutional: She is oriented to person, place, and time and well-developed, well-nourished, and in no distress. Vital signs are normal.  Genitourinary: Uterus normal, cervix normal, right adnexa normal, left adnexa normal and vulva normal. Uterus is not enlarged. Cervix exhibits no motion tenderness and no tenderness. Right adnexum displays no mass and no tenderness. Left adnexum displays no mass and no tenderness. Vulva exhibits no erythema, no exudate, no lesion, no rash and no tenderness. Vagina exhibits no lesion. Thin  clear and vaginal discharge found.  Neurological: She is oriented to person, place, and time.  Vitals reviewed.   Results: Results for orders placed or performed in visit on 08/06/17 (from the past 24 hour(s))  POCT Urinalysis Dipstick     Status: Abnormal   Collection Time: 08/06/17 10:27 AM  Result Value Ref Range   Color, UA yellow    Clarity, UA clear    Glucose, UA neg    Bilirubin, UA neg    Ketones, UA neg    Spec Grav, UA 1.025 1.010 - 1.025   Blood, UA mod    pH, UA 6.0 5.0 - 8.0   Protein, UA neg    Urobilinogen, UA  0.2 or 1.0 E.U./dL   Nitrite, UA pos    Leukocytes, UA Moderate (2+) (A) Negative  POCT Wet Prep with KOH     Status: Abnormal   Collection Time: 08/06/17 10:28 AM  Result Value Ref Range   Trichomonas, UA Negative    Clue Cells Wet Prep HPF POC pos    Epithelial Wet Prep HPF POC  Few, Moderate, Many, Too numerous to count   Yeast Wet Prep HPF POC neg    Bacteria Wet Prep HPF POC  Few   RBC Wet Prep HPF POC     WBC Wet Prep HPF POC     KOH Prep POC Positive (A) Negative     Assessment/Plan: Encounter for removal and reinsertion of intrauterine contraceptive device (IUD) - Plan:  levonorgestrel (MIRENA) 20 MCG/24HR IUD  Acute cystitis with hematuria - Rx macrobid. Check C&S. F/u prn.  - Plan: Urine Culture, POCT Urinalysis Dipstick, nitrofurantoin, macrocrystal-monohydrate, (MACROBID) 100 MG capsule  Bacterial vaginosis - Pos wet prep. Check One Swab AV and BV today. Will call pt wiht results. Complete last 4 doses of cleocin crm. Probiotics/condoms. - Plan: POCT Wet Prep with KOH    IUD PROCEDURE  NOTE  History of Present Illness:  [name] is a 26 y.o. that had a Mirena IUD placed approximately 5 years ago. Since that time, she denies dyspareunia, pelvic pain, non-menstrual bleeding, vaginal d/c, heavy bleeding.    BP 110/64 (BP Location: Left Arm, Patient Position: Sitting, Cuff Size: Normal)   Pulse 80   Ht 5\' 4"  (1.626 m)   Wt 157 lb (71.2 kg)   BMI 26.95 kg/m   Pelvic exam:  Two IUD strings present seen coming from the cervical os. EGBUS, vaginal vault and cervix: within normal limits  IUD Removal Strings of IUD identified and grasped.  IUD removed without problem with ring forceps.  Pt tolerated this well.  IUD noted to be intact.   IUD Insertion Procedure Note Patient identified, informed consent performed, consent signed.   Discussed risks of irregular bleeding, cramping, infection, malpositioning or misplacement of the IUD outside the uterus which may require further procedure such as laparoscopy, risk of failure <1%. Time out was performed.    Speculum placed in the vagina.  Cervix visualized.  Cleaned with Betadine x 2.  Grasped anteriorly with a single tooth tenaculum.  Uterus sounded to 7.5 cm.   IUD placed per manufacturer's recommendations.  Strings trimmed to 3 cm. Tenaculum was removed, good hemostasis noted.  Patient tolerated procedure well.   ASSESSMENT:  Encounter for removal and reinsertion of intrauterine contraceptive device (IUD) - Plan: levonorgestrel (MIRENA) 20 MCG/24HR IUD  Acute cystitis with hematuria - Rx  macrobid. Check C&S. F/u prn.  - Plan: Urine Culture, POCT Urinalysis Dipstick, nitrofurantoin, macrocrystal-monohydrate, (MACROBID) 100 MG capsule  Bacterial vaginosis - Pos wet prep. Check One Swab AV and BV today. Will call pt wiht results. Complete last 4 doses of cleocin crm. Probiotics/condoms. - Plan: POCT Wet Prep with KOH   Meds ordered this encounter  Medications  . nitrofurantoin, macrocrystal-monohydrate, (MACROBID) 100 MG capsule    Sig: Take 1 capsule (100 mg total) by mouth 2 (two) times daily for 5 days.    Dispense:  10 capsule    Refill:  0  . levonorgestrel (MIRENA) 20 MCG/24HR IUD    Sig: 1 Intra Uterine Device (1 each total) by Intrauterine route once for 1 dose.    Dispense:  1 each     Plan:  Patient was given post-procedure instructions.  She was advised to have backup contraception for one week.   Call if you are having increasing pain, cramps or bleeding or if you have a fever greater than 100.4 degrees F., shaking chills, nausea or vomiting. Patient was also asked to check IUD strings periodically and follow up in 4 weeks for IUD check.  Return in about 4 weeks (around 09/03/2017) for IUD check.  Isaul Landi B. Dejanira Pamintuan, PA-C 08/06/2017 10:32 AM

## 2017-08-10 LAB — URINE CULTURE

## 2017-08-11 ENCOUNTER — Telehealth: Payer: Self-pay | Admitting: Obstetrics and Gynecology

## 2017-08-11 MED ORDER — FLUCONAZOLE 150 MG PO TABS
150.0000 mg | ORAL_TABLET | Freq: Once | ORAL | 0 refills | Status: DC
Start: 2017-08-11 — End: 2018-08-12

## 2017-08-11 MED ORDER — MOXIFLOXACIN HCL 400 MG PO TABS: 400.0000 mg | ORAL_TABLET | Freq: Every day | ORAL | 0 refills | Status: DC

## 2017-08-11 NOTE — Telephone Encounter (Signed)
Pt aware of AV and BV on One Swab culture. Pt being treated with cleocin and has 1 dose left. Rx avelox eRxd for AV. F/u prn sx.  Pt also had E coli on C&S which showed intermediate resistance to macrobid. Pt's UTI sx improved. Is sensitive to levaquin so will see if avelox will completely treat UTI bacteria.  Pt not feeling well over wknd and put on amox and prednisone for URI issues. Feeling better now.

## 2017-09-04 ENCOUNTER — Ambulatory Visit: Payer: Managed Care, Other (non HMO) | Admitting: Certified Nurse Midwife

## 2017-11-04 NOTE — Telephone Encounter (Signed)
Mirena received 08/06/17

## 2018-01-15 ENCOUNTER — Encounter: Payer: Self-pay | Admitting: Obstetrics and Gynecology

## 2018-01-15 ENCOUNTER — Other Ambulatory Visit: Payer: Self-pay | Admitting: Obstetrics and Gynecology

## 2018-01-15 DIAGNOSIS — N760 Acute vaginitis: Principal | ICD-10-CM

## 2018-01-15 DIAGNOSIS — B9689 Other specified bacterial agents as the cause of diseases classified elsewhere: Secondary | ICD-10-CM

## 2018-01-15 MED ORDER — METRONIDAZOLE 500 MG PO TABS: ORAL_TABLET | ORAL | 0 refills | Status: DC

## 2018-01-15 NOTE — Progress Notes (Signed)
Rx RF for bv

## 2018-02-07 DIAGNOSIS — R87810 Cervical high risk human papillomavirus (HPV) DNA test positive: Secondary | ICD-10-CM

## 2018-02-07 DIAGNOSIS — R87610 Atypical squamous cells of undetermined significance on cytologic smear of cervix (ASC-US): Secondary | ICD-10-CM

## 2018-02-07 HISTORY — DX: Cervical high risk human papillomavirus (HPV) DNA test positive: R87.810

## 2018-02-07 HISTORY — DX: Atypical squamous cells of undetermined significance on cytologic smear of cervix (ASC-US): R87.610

## 2018-02-10 ENCOUNTER — Encounter: Payer: Self-pay | Admitting: Obstetrics and Gynecology

## 2018-02-10 ENCOUNTER — Ambulatory Visit (INDEPENDENT_AMBULATORY_CARE_PROVIDER_SITE_OTHER): Payer: Managed Care, Other (non HMO) | Admitting: Obstetrics and Gynecology

## 2018-02-10 VITALS — BP 112/70 | HR 64 | Ht 64.0 in | Wt 154.5 lb

## 2018-02-10 DIAGNOSIS — N760 Acute vaginitis: Secondary | ICD-10-CM | POA: Diagnosis not present

## 2018-02-10 DIAGNOSIS — Z124 Encounter for screening for malignant neoplasm of cervix: Secondary | ICD-10-CM

## 2018-02-10 DIAGNOSIS — K5901 Slow transit constipation: Secondary | ICD-10-CM | POA: Diagnosis not present

## 2018-02-10 DIAGNOSIS — Z803 Family history of malignant neoplasm of breast: Secondary | ICD-10-CM | POA: Diagnosis not present

## 2018-02-10 DIAGNOSIS — Z113 Encounter for screening for infections with a predominantly sexual mode of transmission: Secondary | ICD-10-CM

## 2018-02-10 DIAGNOSIS — B9689 Other specified bacterial agents as the cause of diseases classified elsewhere: Secondary | ICD-10-CM | POA: Diagnosis not present

## 2018-02-10 DIAGNOSIS — Z30431 Encounter for routine checking of intrauterine contraceptive device: Secondary | ICD-10-CM

## 2018-02-10 DIAGNOSIS — Z01419 Encounter for gynecological examination (general) (routine) without abnormal findings: Secondary | ICD-10-CM | POA: Diagnosis not present

## 2018-02-10 LAB — POCT WET PREP WITH KOH
Clue Cells Wet Prep HPF POC: POSITIVE
KOH PREP POC: POSITIVE — AB
Trichomonas, UA: NEGATIVE
Yeast Wet Prep HPF POC: NEGATIVE

## 2018-02-10 MED ORDER — CLINDAMYCIN PHOSPHATE 2 % VA CREA: 1.0000 | TOPICAL_CREAM | Freq: Every day | VAGINAL | 0 refills | Status: DC

## 2018-02-10 MED ORDER — LUBIPROSTONE 8 MCG PO CAPS: 8.0000 ug | ORAL_CAPSULE | Freq: Two times a day (BID) | ORAL | 2 refills | Status: DC

## 2018-02-10 NOTE — Progress Notes (Signed)
Chief Complaint  Patient presents with  . Gynecologic Exam    Discharge and burning     HPI:      Beth Peterson is a 27 y.o. G0P0000 who LMP was No LMP recorded. (Menstrual status: IUD)., presents today for her annual examination. Her menses are absent with the IUD. Dysmenorrhea none. She does have intermenstrual bleeding.  Sex activity: single partner, contraception - IUD. Mirena placed 08/06/17. Last Pap: 01/21/17 Results were: no abnormalities  Hx of STDs: none. She would like full STD testing.   There is a FH of breast cancer in her mat aunt and MGM, genetic testing not done. Pt unsure of age of dx of MGM. There is no FH of ovarian cancer. The patient does not do self-breast exams.  Tobacco use: The patient denies current or previous tobacco use. Alcohol use: social drinker Exercise: not active  She does get adequate calcium and Vitamin D in her diet.  She has a hx of chronic constipation probs. Started amitiza last yr that has helped her. Needs Rx RF  She had BV and AV 11/18 on One Swab culture, treated with cleocin and avelox with sx relief. Had BV sx again 5/19 and was treated with flagyl. Sx resolved initially but has had increased vag d/c with burning, no itch/odor for a few days. Not taking probiotics. No soap/dryer sheets.    Past Medical History:  Diagnosis Date  . Abnormal Pap smear of cervix 2016  . Chest pain   . Constipation   . Dysphagia   . Eosinophilic gastroenteritis   . GERD (gastroesophageal reflux disease)   . History of hiatal hernia   . Ovarian cyst   . UTI (urinary tract infection)     Past Surgical History:  Procedure Laterality Date  . APPENDECTOMY  2012   [id number]  . ESOPHAGOGASTRODUODENOSCOPY  04/02/12   Fields-proximal esophageal web, small hiatal hernia, mild non-H. pylori gastritis, 12.8-16mm savory dilation  . ESOPHAGOGASTRODUODENOSCOPY (EGD) WITH PROPOFOL N/A 08/07/2015   Procedure: ESOPHAGOGASTRODUODENOSCOPY  (EGD) WITH PROPOFOL;  Surgeon: Christena DeemMartin U Skulskie, MD;  Location[facility] ENDOSCOPY;  Service: Endoscopy;  Laterality: N/A;  . INTRAUTERINE DEVICE (IUD) INSERTION    . ovarian cyst removed    . uterus tacked  2012    Family History  Problem Relation Age of Onset  . Hypertension Mother   . Breast cancer Maternal Grandmother        ? age of dx  . Breast cancer Maternal Aunt [id number]    Social History   Socioeconomic History  . Marital status: Single    Spouse name: Not on file  . Number of children: 0  . Years of education: Not on file  . Highest education level: Not on file  Occupational History    Employer: [employer]  Social Needs  . Financial resource strain: Not on file  . Food insecurity:    Worry: Not on file    Inability: Not on file  . Transportation needs:    Medical: Not on file    Non-medical: Not on file  Tobacco Use  . Smoking status: Never Smoker  . Smokeless tobacco: Never Used  Substance and Sexual Activity  . Alcohol use: Yes    Comment: RARE  . Drug use: No  . Sexual activity: Yes    Birth control/protection: IUD    Comment: Mirena   Lifestyle  . Physical activity:    Days per week: Not on file  Minutes per session: Not on file  . Stress: Not on file  Relationships  . Social connections:    Talks on phone: Not on file    Gets together: Not on file    Attends religious service: Not on file    Active member of club or organization: Not on file    Attends meetings of clubs or organizations: Not on file    Relationship status: Not on file  . Intimate partner violence:    Fear of current or ex partner: Not on file    Emotionally abused: Not on file    Physically abused: Not on file    Forced sexual activity: Not on file  Other Topics Concern  . Not on file  Social History Narrative   Lives w/ mother    Current Outpatient Medications on File Prior to Visit  Medication Sig Dispense Refill  . levonorgestrel (MIRENA) 20 MCG/24HR IUD 1 Intra  Uterine Device (1 each total) by Intrauterine route once for 1 dose. 1 each   . metroNIDAZOLE (FLAGYL) 500 MG tablet Take 1 tab BID for 7 days (Patient not taking: Reported on 02/10/2018) 14 tablet 0   No current facility-administered medications on file prior to visit.       ROS:  Review of Systems  Constitutional: Negative for fatigue, fever and unexpected weight change.  Respiratory: Negative for cough, shortness of breath and wheezing.   Cardiovascular: Negative for chest pain, palpitations and leg swelling.  Gastrointestinal: Negative for blood in stool, constipation, diarrhea, nausea and vomiting.  Endocrine: Negative for cold intolerance, heat intolerance and polyuria.  Genitourinary: Positive for vaginal discharge. Negative for dyspareunia, dysuria, flank pain, frequency, genital sores, hematuria, menstrual problem, pelvic pain, urgency, vaginal bleeding and vaginal pain.  Musculoskeletal: Negative for back pain, joint swelling and myalgias.  Skin: Negative for rash.  Neurological: Negative for dizziness, syncope, light-headedness, numbness and headaches.  Hematological: Negative for adenopathy.  Psychiatric/Behavioral: Negative for agitation, confusion, sleep disturbance and suicidal ideas. The patient is not nervous/anxious.      Objective: BP 112/70   Pulse 64   Ht 5\' 4"  (1.626 m)   Wt 154 lb 8 oz (70.1 kg)   BMI 26.52 kg/m    Physical Exam  Constitutional: She is oriented to person, place, and time. She appears well-developed and well-nourished.  Genitourinary: Vagina normal and uterus normal. There is no rash or tenderness on the right labia. There is no rash or tenderness on the left labia. No erythema or tenderness in the vagina. No vaginal discharge found. Right adnexum does not display mass and does not display tenderness. Left adnexum does not display mass and does not display tenderness.  Cervix exhibits visible IUD strings. Cervix does not exhibit motion  tenderness or polyp. Uterus is not enlarged or tender.  Neck: Normal range of motion. No thyromegaly present.  Cardiovascular: Normal rate, regular rhythm and normal heart sounds.  No murmur heard. Pulmonary/Chest: Effort normal and breath sounds normal. Right breast exhibits no mass, no nipple discharge, no skin change and no tenderness. Left breast exhibits no mass, no nipple discharge, no skin change and no tenderness.  Abdominal: Soft. There is no tenderness. There is no guarding.  Musculoskeletal: Normal range of motion.  Neurological: She is alert and oriented to person, place, and time. No cranial nerve deficit.  Psychiatric: She has a normal mood and affect. Her behavior is normal.  Vitals reviewed.   Results: Results for orders placed or performed in visit on  02/10/18 (from the past 24 hour(s))  POCT Wet Prep with KOH     Status: Abnormal   Collection Time: 02/10/18  2:33 PM  Result Value Ref Range   Trichomonas, UA Negative    Clue Cells Wet Prep HPF POC pos    Epithelial Wet Prep HPF POC  Few, Moderate, Many, Too numerous to count   Yeast Wet Prep HPF POC neg    Bacteria Wet Prep HPF POC  Few   RBC Wet Prep HPF POC     WBC Wet Prep HPF POC     KOH Prep POC Positive (A) Negative    Assessment/Plan: Encounter for annual routine gynecological examination  Cervical cancer screening - Plan: IGP,CtNgTv,rfx Aptima HPV ASCU, CANCELED: IGP, rfx Aptima HPV ASCU  Screening for STD (sexually transmitted disease) - Plan: IGP,CtNgTv,rfx Aptima HPV ASCU, HIV antibody, RPR, HSV 2 antibody, IgG, Hepatitis C antibody  Encounter for routine checking of intrauterine contraceptive device (IUD) - IUD in place. DOing well.   Bacterial vaginosis - Pos sx/wet prep. Rx cleocin. Add probiotics/use condoms. May need maint tx if sx cont to recur. F/u prn. - Plan: POCT Wet Prep with KOH, clindamycin (CLEOCIN) 2 % vaginal cream  Slow transit constipation - Try amitiza. Rx eRxd. F/u prn. May need  ref to GI if sx persist. - Plan: lubiprostone (AMITIZA) 8 MCG capsule  Family history of breast cancer - Pt to clarify age of dx of MGM. May be at increased risk and need to start mammos next yr.  Meds ordered this encounter  Medications  . clindamycin (CLEOCIN) 2 % vaginal cream    Sig: Place 1 Applicatorful vaginally at bedtime.    Dispense:  40 g    Refill:  0    Order Specific Question:   Supervising Provider    Answer:   Nadara Mustard [id number]  . lubiprostone (AMITIZA) 8 MCG capsule    Sig: Take 1 capsule (8 mcg total) by mouth 2 (two) times daily with a meal.    Dispense:  60 capsule    Refill:  2    Order Specific Question:   Supervising Provider    Answer:   Nadara Mustard [[id number]]             GYN counsel adequate intake of calcium and vitamin D, diet and exercise     F/U  Return in about 1 year (around 02/11/2019).  Beth Zuver B. Shloka Baldridge, PA-C 02/10/2018 2:36 PM

## 2018-02-10 NOTE — Patient Instructions (Signed)
I value your feedback and entrusting us with your care. If you get a [facility] patient survey, I would appreciate you taking the time to let us know about your experience today. Thank you! 

## 2018-02-11 LAB — RPR: RPR Ser Ql: NONREACTIVE

## 2018-02-11 LAB — HIV ANTIBODY (ROUTINE TESTING W REFLEX): HIV Screen 4th Generation wRfx: NONREACTIVE

## 2018-02-11 LAB — HSV 2 ANTIBODY, IGG: HSV 2 IgG, Type Spec: <0.91 index (ref 0.00–0.90)

## 2018-02-11 LAB — HEPATITIS C ANTIBODY

## 2018-02-18 ENCOUNTER — Encounter: Payer: Self-pay | Admitting: Obstetrics and Gynecology

## 2018-02-18 LAB — IGP,CTNGTV,RFX APTIMA HPV ASCU
Chlamydia, Nuc. Acid Amp: NEGATIVE
GONOCOCCUS, NUC. ACID AMP: NEGATIVE
PAP Smear Comment: 0
Trich vag by NAA: NEGATIVE

## 2018-02-18 LAB — HPV APTIMA: HPV Aptima: POSITIVE — AB

## 2018-02-26 ENCOUNTER — Ambulatory Visit: Payer: Managed Care, Other (non HMO) | Admitting: Obstetrics & Gynecology

## 2018-02-26 ENCOUNTER — Encounter: Payer: Self-pay | Admitting: Obstetrics & Gynecology

## 2018-02-26 VITALS — BP 102/70 | Ht 64.0 in | Wt 151.0 lb

## 2018-02-26 DIAGNOSIS — R87810 Cervical high risk human papillomavirus (HPV) DNA test positive: Secondary | ICD-10-CM | POA: Diagnosis not present

## 2018-02-26 DIAGNOSIS — R87610 Atypical squamous cells of undetermined significance on cytologic smear of cervix (ASC-US): Secondary | ICD-10-CM | POA: Diagnosis not present

## 2018-02-26 NOTE — Progress Notes (Signed)
Referring Provider:  ABC  HPI:  Beth Peterson is a 27 y.o.  G0P0000  who presents today for evaluation and management of abnormal cervical cytology.    Dysplasia History:  ASCUS w HPV, prior PAPs all normal  ROS:  Pertinent items are noted in HPI.  OB History  Gravida Para Term Preterm AB Living  0 0 0 0 0 0  SAB TAB Ectopic Multiple Live Births  0 0 0 0 0   Past Medical History:  Diagnosis Date  . Abnormal Pap smear of cervix 2016  . 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 Chest pain   . Constipation   . Dysphagia   . Eosinophilic gastroenteritis   . GERD (gastroesophageal reflux disease)   . History of hiatal hernia   . Ovarian cyst   . UTI (urinary tract infection)    Past Surgical History:  Procedure Laterality Date  . APPENDECTOMY  2012   [facility] [id number]  . ESOPHAGOGASTRODUODENOSCOPY  04/02/12   Fields-proximal esophageal web, small hiatal hernia, mild non-H. pylori gastritis, 12.8-16mm savory dilation  . ESOPHAGOGASTRODUODENOSCOPY (EGD) WITH PROPOFOL N/A 08/07/2015   Procedure: ESOPHAGOGASTRODUODENOSCOPY (EGD) WITH PROPOFOL;  Surgeon: Christena Deem, MD;  Location: [facility] ENDOSCOPY;  Service: Endoscopy;  Laterality: N/A;  . INTRAUTERINE DEVICE (IUD) INSERTION    . ovarian cyst removed    . uterus tacked  2012   SOCIAL HISTORY: Social History   Substance and Sexual Activity  Alcohol Use Yes   Comment: RARE   Social History   Substance and Sexual Activity  Drug Use No   Family History  Problem Relation Age of Onset  . Hypertension Mother   . Breast cancer Maternal Grandmother        ? age of dx  . Breast cancer Maternal Aunt 55    ALLERGIES:  Dexlansoprazole  Current Outpatient Medications on File Prior to Visit  Medication Sig Dispense Refill  . lubiprostone (AMITIZA) 8 MCG capsule Take 1 capsule (8 mcg total) by mouth 2 (two) times daily with a meal. 60 capsule 2  . clindamycin (CLEOCIN) 2 % vaginal cream Place 1 Applicatorful  vaginally at bedtime. (Patient not taking: Reported on 02/26/2018) 40 g 0  . levonorgestrel (MIRENA) 20 MCG/24HR IUD 1 Intra Uterine Device (1 each total) by Intrauterine route once for 1 dose. 1 each   . metroNIDAZOLE (FLAGYL) 500 MG tablet Take 1 tab BID for 7 days (Patient not taking: Reported on 02/10/2018) 14 tablet 0   No current facility-administered medications on file prior to visit.    Physical Exam: -Vitals:  BP 102/70   Ht 5\' 4"  (1.626 m)   Wt 151 lb (68.5 kg)   BMI 25.92 kg/m  GEN: WD, WN, NAD.  A+ O x 3, good mood and affect. ABD:  NT, ND.  Soft, no masses.  No hernias noted.   Pelvic:   Vulva: Normal appearance.  No lesions.  Vagina: No lesions or abnormalities noted.  Support: Normal pelvic support.  Urethra No masses tenderness or scarring.  Meatus Normal size without lesions or prolapse.  Cervix: See below.  Anus: Normal exam.  No lesions.  Perineum: Normal exam.  No lesions.        Bimanual   Uterus: Normal size.  Non-tender.  Mobile.  AV.  Adnexae: No masses.  Non-tender to palpation.  Cul-de-sac: Negative for abnormality.   PROCEDURE: 1.  Urine Pregnancy Test:  not done. IUD strings visualized 2.  Colposcopy performed  with 4% acetic acid after verbal consent obtained                                        -Aceto-white Lesions Location(s): none.              -Biopsy performed at 6, 12 o'clock               -ECC indicated and performed: Yes.       -Biopsy sites made hemostatic with pressure, AgNO3, and/or Monsel's solution   -Satisfactory colposcopy: Yes.      -Evidence of Invasive cervical CA :  NO  ASSESSMENT:  Beth Peterson is a 27 y.o. G0P0000 here for  1. ASCUS with positive high risk HPV cervical    PLAN: 1.  I discussed the grading system of pap smears and HPV high risk viral types.  We will discuss and base management after colpo results return. 2. Follow up PAP 6 months, vs intervention if high grade dysplasia identified 3. Treatment of  persistantly abnormal PAP smears and cervical dysplasia, even mild, is discussed w pt today in detail, as well as the pros and cons of Cryo and LEEP procedures. Will consider and discuss after results.     Annamarie MajorPaul Danne Vasek, MD, Merlinda FrederickFACOG [facility], [facility] 02/26/2018  2:23 PM

## 2018-02-26 NOTE — Patient Instructions (Signed)

## 2018-03-02 ENCOUNTER — Encounter: Payer: Self-pay | Admitting: Obstetrics and Gynecology

## 2018-03-02 DIAGNOSIS — R3915 Urgency of urination: Secondary | ICD-10-CM

## 2018-03-02 LAB — PATHOLOGY

## 2018-03-02 NOTE — Telephone Encounter (Signed)
Please advise 

## 2018-03-04 ENCOUNTER — Ambulatory Visit (INDEPENDENT_AMBULATORY_CARE_PROVIDER_SITE_OTHER): Payer: Managed Care, Other (non HMO) | Admitting: Obstetrics and Gynecology

## 2018-03-04 DIAGNOSIS — R300 Dysuria: Secondary | ICD-10-CM | POA: Diagnosis not present

## 2018-03-04 DIAGNOSIS — B9689 Other specified bacterial agents as the cause of diseases classified elsewhere: Secondary | ICD-10-CM

## 2018-03-04 DIAGNOSIS — N760 Acute vaginitis: Secondary | ICD-10-CM

## 2018-03-04 LAB — POCT URINALYSIS DIPSTICK
BILIRUBIN UA: NEGATIVE
Glucose, UA: NEGATIVE
Ketones, UA: NEGATIVE
LEUKOCYTES UA: NEGATIVE
Nitrite, UA: NEGATIVE
PH UA: 5 (ref 5.0–8.0)
PROTEIN UA: NEGATIVE
Spec Grav, UA: 1.01 (ref 1.010–1.025)
UROBILINOGEN UA: 0.2 U/dL

## 2018-03-04 MED ORDER — MOXIFLOXACIN HCL 400 MG PO TABS: 400.0000 mg | ORAL_TABLET | Freq: Every day | ORAL | 0 refills | Status: DC

## 2018-03-04 MED ORDER — CLINDAMYCIN PHOSPHATE 2 % VA CREA: 1.0000 | TOPICAL_CREAM | Freq: Every day | VAGINAL | 0 refills | Status: DC

## 2018-03-04 NOTE — Progress Notes (Signed)
Pt still with increased d/c and fishy odor, as well as vag burning. Treated with cleocin with sx relief initially but sx recurred a couple wks later. Hx of recurrent BV. Will start maintenance med. Rx cleocin once wkly for 3 months/condoms. Rx avelox for hx of AV since still has burning sx. Had neg dip today (except blood but still bleeding from colpo), will check C&S to make sure.

## 2018-03-04 NOTE — Patient Instructions (Signed)
I value your feedback and entrusting us with your care. If you get a [facility] patient survey, I would appreciate you taking the time to let us know about your experience today. Thank you! 

## 2018-03-06 ENCOUNTER — Encounter: Payer: Self-pay | Admitting: Obstetrics and Gynecology

## 2018-03-06 LAB — URINE CULTURE

## 2018-03-06 MED ORDER — AMOXICILLIN 500 MG PO CAPS: 500.0000 mg | ORAL_CAPSULE | Freq: Two times a day (BID) | ORAL | 0 refills | Status: AC

## 2018-03-16 NOTE — Telephone Encounter (Signed)
This encounter was created in error - please disregard.

## 2018-03-22 ENCOUNTER — Encounter: Payer: Self-pay | Admitting: Obstetrics and Gynecology

## 2018-03-31 ENCOUNTER — Encounter: Payer: Self-pay | Admitting: Obstetrics and Gynecology

## 2018-03-31 ENCOUNTER — Ambulatory Visit (INDEPENDENT_AMBULATORY_CARE_PROVIDER_SITE_OTHER): Payer: Managed Care, Other (non HMO) | Admitting: Obstetrics and Gynecology

## 2018-03-31 VITALS — BP 116/74 | HR 99 | Ht 64.0 in | Wt 152.0 lb

## 2018-03-31 DIAGNOSIS — N760 Acute vaginitis: Secondary | ICD-10-CM | POA: Diagnosis not present

## 2018-03-31 DIAGNOSIS — R350 Frequency of micturition: Secondary | ICD-10-CM | POA: Diagnosis not present

## 2018-03-31 DIAGNOSIS — B9689 Other specified bacterial agents as the cause of diseases classified elsewhere: Secondary | ICD-10-CM

## 2018-03-31 DIAGNOSIS — N72 Inflammatory disease of cervix uteri: Secondary | ICD-10-CM | POA: Diagnosis not present

## 2018-03-31 DIAGNOSIS — N930 Postcoital and contact bleeding: Secondary | ICD-10-CM

## 2018-03-31 LAB — POCT URINALYSIS DIPSTICK
Bilirubin, UA: NEGATIVE
Glucose, UA: NEGATIVE
Ketones, UA: NEGATIVE
LEUKOCYTES UA: NEGATIVE
Nitrite, UA: NEGATIVE
PH UA: 5 (ref 5.0–8.0)
Protein, UA: NEGATIVE
RBC UA: NEGATIVE
Spec Grav, UA: 1.025 (ref 1.010–1.025)

## 2018-03-31 LAB — POCT WET PREP WITH KOH
CLUE CELLS WET PREP PER HPF POC: POSITIVE
KOH Prep POC: POSITIVE — AB
TRICHOMONAS UA: NEGATIVE
Yeast Wet Prep HPF POC: NEGATIVE

## 2018-03-31 MED ORDER — METRONIDAZOLE 500 MG PO TABS: ORAL_TABLET | ORAL | 0 refills | Status: DC

## 2018-03-31 MED ORDER — DOXYCYCLINE HYCLATE 100 MG PO CAPS: 100.0000 mg | ORAL_CAPSULE | Freq: Two times a day (BID) | ORAL | 0 refills | Status: DC

## 2018-03-31 NOTE — Progress Notes (Signed)
Patient, No Pcp Per   Chief Complaint  Patient presents with  . Follow-up    HPI:      [name] is a 27 y.o. G0P0000 who LMP was No LMP recorded. (Menstrual status: IUD)., presents today for BV and UTI sx again. Pt has been treated for both a couple times since 10/18. She has had  BV mult times and AV on culture. Pt responds to flagyl and cleocin tx temporarily. She has had E coli and GBS on urine culture, last treated with amox 6/19 for GBS. She is taking probiotics now.  Has had increased d/c, mild odor recently, as well as mild belly pain. No LBP, fevers. No new partners. Has also had urinary frequency/urgency, dysuria. She has also had bleeding with sex the past 2 times, and that has been since colpo/bx 6/19. Bleeding stops after sex. Pt has Mirena, no problems with it.   Past Medical History:  Diagnosis Date  . Abnormal Pap smear of cervix 2016  . 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 Chest pain   . Constipation   . Dysphagia   . Eosinophilic gastroenteritis   . GERD (gastroesophageal reflux disease)   . History of hiatal hernia   . Ovarian cyst   . UTI (urinary tract infection)     Past Surgical History:  Procedure Laterality Date  . APPENDECTOMY  2012   [facility]  . ESOPHAGOGASTRODUODENOSCOPY  04/02/12   Fields-proximal esophageal web, small hiatal hernia, mild non-H. pylori gastritis, 12.8-16mm savory dilation  . ESOPHAGOGASTRODUODENOSCOPY (EGD) WITH PROPOFOL N/A 08/07/2015   Procedure: ESOPHAGOGASTRODUODENOSCOPY (EGD) WITH PROPOFOL;  Surgeon: Christena Deem, MD;  Location: [facility] ENDOSCOPY;  Service: Endoscopy;  Laterality: N/A;  . INTRAUTERINE DEVICE (IUD) INSERTION    . ovarian cyst removed    . uterus tacked  2012    Family History  Problem Relation Age of Onset  . Hypertension Mother   . Breast cancer Maternal Grandmother        ? age of dx  . Breast cancer Maternal Aunt 38    Social History   Socioeconomic History   . Marital status: Single    Spouse name: Not on file  . Number of children: 0  . Years of education: Not on file  . Highest education level: Not on file  Occupational History    Employer: [employer]  Social Needs  . Financial resource strain: Not on file  . Food insecurity:    Worry: Not on file    Inability: Not on file  . Transportation needs:    Medical: Not on file    Non-medical: Not on file  Tobacco Use  . Smoking status: Never Smoker  . Smokeless tobacco: Never Used  Substance and Sexual Activity  . Alcohol use: Yes    Comment: RARE  . Drug use: No  . Sexual activity: Yes    Birth control/protection: IUD    Comment: Mirena   Lifestyle  . Physical activity:    Days per week: Not on file    Minutes per session: Not on file  . Stress: Not on file  Relationships  . Social connections:    Talks on phone: Not on file    Gets together: Not on file    Attends religious service: Not on file    Active member of club or organization: Not on file    Attends meetings of clubs or organizations: Not on file  Relationship status: Not on file  . Intimate partner violence:    Fear of current or ex partner: Not on file    Emotionally abused: Not on file    Physically abused: Not on file    Forced sexual activity: Not on file  Other Topics Concern  . Not on file  Social History Narrative   Lives w/ mother    Outpatient Medications Prior to Visit  Medication Sig Dispense Refill  . clindamycin (CLEOCIN) 2 % vaginal cream Place 1 Applicatorful vaginally at bedtime. 40 g 0  . lubiprostone (AMITIZA) 8 MCG capsule Take 1 capsule (8 mcg total) by mouth 2 (two) times daily with a meal. 60 capsule 2  . levonorgestrel (MIRENA) 20 MCG/24HR IUD 1 Intra Uterine Device (1 each total) by Intrauterine route once for 1 dose. 1 each   . metroNIDAZOLE (FLAGYL) 500 MG tablet Take 1 tab BID for 7 days (Patient not taking: Reported on 02/10/2018) 14 tablet 0   No facility-administered  medications prior to visit.       ROS:  Review of Systems  Constitutional: Negative for fever.  Gastrointestinal: Negative for blood in stool, constipation, diarrhea, nausea and vomiting.  Genitourinary: Positive for dysuria, frequency, urgency, vaginal bleeding and vaginal discharge. Negative for dyspareunia, flank pain, hematuria and vaginal pain.  Musculoskeletal: Negative for back pain.  Skin: Negative for rash.    OBJECTIVE:   Vitals:  BP 116/74 (BP Location: Left Arm, Patient Position: Sitting, Cuff Size: Normal)   Pulse 99   Ht 5\' 4"  (1.626 m)   Wt 152 lb (68.9 kg)   SpO2 96%   BMI 26.09 kg/m   Physical Exam  Constitutional: She is oriented to person, place, and time. Vital signs are normal. She appears well-developed.  Pulmonary/Chest: Effort normal.  Genitourinary: Vagina normal and uterus normal. There is no rash, tenderness or lesion on the right labia. There is no rash, tenderness or lesion on the left labia. Uterus is not enlarged and not tender. Cervix exhibits friability. Cervix exhibits no motion tenderness. Right adnexum displays no mass and no tenderness. Left adnexum displays no mass and no tenderness. No erythema or tenderness in the vagina. No vaginal discharge found.  Musculoskeletal: Normal range of motion.  Neurological: She is alert and oriented to person, place, and time.  Psychiatric: She has a normal mood and affect. Her behavior is normal. Thought content normal.  Vitals reviewed.   Results: Results for orders placed or performed in visit on 03/31/18 (from the past 24 hour(s))  POCT Wet Prep with KOH     Status: Abnormal   Collection Time: 03/31/18  9:28 AM  Result Value Ref Range   Trichomonas, UA Negative    Clue Cells Wet Prep HPF POC pos    Epithelial Wet Prep HPF POC  Few, Moderate, Many, Too numerous to count   Yeast Wet Prep HPF POC neg    Bacteria Wet Prep HPF POC  Few   RBC Wet Prep HPF POC     WBC Wet Prep HPF POC     KOH Prep POC  Positive (A) Negative  POCT Urinalysis Dipstick     Status: Normal   Collection Time: 03/31/18  9:29 AM  Result Value Ref Range   Color, UA straw    Clarity, UA clear    Glucose, UA Negative Negative   Bilirubin, UA neg    Ketones, UA neg    Spec Grav, UA 1.025 1.010 - 1.025   Blood,  UA neg    pH, UA 5.0 5.0 - 8.0   Protein, UA Negative Negative   Urobilinogen, UA  0.2 or 1.0 E.U./dL   Nitrite, UA neg    Leukocytes, UA Negative Negative   Appearance     Odor       Assessment/Plan: Bacterial vaginosis - Pos wet prep/sx. Rx flagyl. Check nuswab culture. Will call with results. May need maintenance tx. Cont probiotics/add condoms.  - Plan: POCT Wet Prep with KOH, NuSwab Vaginitis Plus (VG+), metroNIDAZOLE (FLAGYL) 500 MG tablet  Cervicitis - Friable cx on exam. Rx doxy. Check nuswab. F/u prn - Plan: NuSwab Vaginitis Plus (VG+), doxycycline (VIBRAMYCIN) 100 MG capsule  Postcoital and contact bleeding  Urinary frequency - Neg dip. Check C&S. Will f/u with resutls.  - Plan: Urine Culture, POCT Urinalysis Dipstick    Meds ordered this encounter  Medications  . doxycycline (VIBRAMYCIN) 100 MG capsule    Sig: Take 1 capsule (100 mg total) by mouth 2 (two) times daily.    Dispense:  14 capsule    Refill:  0    Order Specific Question:   Supervising Provider    Answer:   Nadara MustardHARRIS, ROBERT P [id number]]  . metroNIDAZOLE (FLAGYL) 500 MG tablet    Sig: Take 1 tab BID for 7 days    Dispense:  14 tablet    Refill:  0    Order Specific Question:   Supervising Provider    Answer:   Nadara MustardHARRIS, ROBERT P [[id number]]      Return if symptoms worsen or fail to improve.  Jamiel Goncalves B. Arash Karstens, PA-C 03/31/2018 9:33 AM

## 2018-03-31 NOTE — Patient Instructions (Signed)
I value your feedback and entrusting us with your care. If you get a [place] patient survey, I would appreciate you taking the time to let us know about your experience today. Thank you! 

## 2018-04-02 LAB — URINE CULTURE

## 2018-04-04 ENCOUNTER — Encounter: Payer: Self-pay | Admitting: Obstetrics and Gynecology

## 2018-04-04 LAB — NUSWAB VAGINITIS PLUS (VG+)
CANDIDA ALBICANS, NAA: NEGATIVE
CANDIDA GLABRATA, NAA: NEGATIVE
Chlamydia trachomatis, NAA: NEGATIVE
Neisseria gonorrhoeae, NAA: NEGATIVE
Trich vag by NAA: NEGATIVE

## 2018-04-17 ENCOUNTER — Encounter: Payer: Self-pay | Admitting: Obstetrics and Gynecology

## 2018-04-28 ENCOUNTER — Other Ambulatory Visit: Payer: Self-pay | Admitting: Obstetrics and Gynecology

## 2018-04-28 DIAGNOSIS — N760 Acute vaginitis: Principal | ICD-10-CM

## 2018-04-28 DIAGNOSIS — B9689 Other specified bacterial agents as the cause of diseases classified elsewhere: Secondary | ICD-10-CM

## 2018-04-28 MED ORDER — METRONIDAZOLE 500 MG PO TABS: ORAL_TABLET | ORAL | 0 refills | Status: DC

## 2018-04-28 MED ORDER — CLINDAMYCIN PHOSPHATE 2 % VA CREA: TOPICAL_CREAM | VAGINAL | 2 refills | Status: DC

## 2018-04-28 NOTE — Progress Notes (Signed)
Rx RF for BV. Then start maintenance tx with cleocin qwk.

## 2018-06-15 ENCOUNTER — Encounter: Payer: Self-pay | Admitting: Obstetrics and Gynecology

## 2018-06-15 ENCOUNTER — Other Ambulatory Visit: Payer: Self-pay | Admitting: Obstetrics and Gynecology

## 2018-06-15 MED ORDER — SECNIDAZOLE 2 G PO PACK: 2.0000 g | PACK | Freq: Once | ORAL | 0 refills | Status: AC

## 2018-06-15 NOTE — Progress Notes (Signed)
Rx solosec for BV.

## 2018-06-23 ENCOUNTER — Encounter: Payer: Self-pay | Admitting: Obstetrics and Gynecology

## 2018-06-30 ENCOUNTER — Encounter: Payer: Self-pay | Admitting: Obstetrics and Gynecology

## 2018-06-30 ENCOUNTER — Ambulatory Visit: Payer: Managed Care, Other (non HMO) | Admitting: Obstetrics and Gynecology

## 2018-06-30 VITALS — BP 108/60 | HR 88 | Ht 64.0 in | Wt 164.0 lb

## 2018-06-30 DIAGNOSIS — N760 Acute vaginitis: Secondary | ICD-10-CM | POA: Diagnosis not present

## 2018-06-30 DIAGNOSIS — R3915 Urgency of urination: Secondary | ICD-10-CM | POA: Diagnosis not present

## 2018-06-30 LAB — POCT URINALYSIS DIPSTICK
Bilirubin, UA: NEGATIVE
Glucose, UA: NEGATIVE
KETONES UA: NEGATIVE
Leukocytes, UA: NEGATIVE
NITRITE UA: NEGATIVE
PROTEIN UA: NEGATIVE
SPEC GRAV UA: 1.015 (ref 1.010–1.025)
pH, UA: 6 (ref 5.0–8.0)

## 2018-06-30 LAB — POCT WET PREP WITH KOH
Clue Cells Wet Prep HPF POC: NEGATIVE
KOH PREP POC: NEGATIVE
TRICHOMONAS UA: NEGATIVE
YEAST WET PREP PER HPF POC: NEGATIVE

## 2018-06-30 MED ORDER — MOXIFLOXACIN HCL 400 MG PO TABS: 400.0000 mg | ORAL_TABLET | Freq: Every day | ORAL | 0 refills | Status: DC

## 2018-06-30 NOTE — Progress Notes (Signed)
Patient, No Pcp Per   Chief Complaint  Patient presents with  . Vaginitis    discharge, no odor or itchiness, pelvic pain x a week  . [employer] employee    HPI:      [name] is a 27 y.o. G0P0000 who LMP was No LMP recorded. (Menstrual status: IUD)., presents today for increased d/c with burning sensation vaginally, no fishy odor. Has had mild pelvic discomfort and urinary urgency, but also just started bleeding with Mirena. Also has hx of constipation. She denies LBP, fevers. She is sex active, no new partners. She has a hx of recurrent BV and AV. Last treated for BV with solosec 06/15/18 with sx relief. Uses clindamycin crm once wkly as preventive.  She is sex active, no new partners. She uses dove sens skin soap, no dryer sheets, is taking probiotics and uses condoms most of the time.   Past Medical History:  Diagnosis Date  . Abnormal Pap smear of cervix 2016  . 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 Chest pain   . Constipation   . Dysphagia   . Eosinophilic gastroenteritis   . GERD (gastroesophageal reflux disease)   . History of hiatal hernia   . Ovarian cyst   . UTI (urinary tract infection)     Past Surgical History:  Procedure Laterality Date  . APPENDECTOMY  2012   [facility]  . ESOPHAGOGASTRODUODENOSCOPY  04/02/12   Fields-proximal esophageal web, small hiatal hernia, mild non-H. pylori gastritis, 12.8-16mm savory dilation  . ESOPHAGOGASTRODUODENOSCOPY (EGD) WITH PROPOFOL N/A 08/07/2015   Procedure: ESOPHAGOGASTRODUODENOSCOPY (EGD) WITH PROPOFOL;  Surgeon: Christena Deem, MD;  Location: [facility] ENDOSCOPY;  Service: Endoscopy;  Laterality: N/A;  . INTRAUTERINE DEVICE (IUD) INSERTION    . ovarian cyst removed    . uterus tacked  2012    Family History  Problem Relation Age of Onset  . Hypertension Mother   . Breast cancer Maternal Grandmother        ? age of dx  . Breast cancer Maternal Aunt 40    Social History    Socioeconomic History  . Marital status: Single    Spouse name: Not on file  . Number of children: 0  . Years of education: Not on file  . Highest education level: Not on file  Occupational History    Employer: [employer]  Social Needs  . Financial resource strain: Not on file  . Food insecurity:    Worry: Not on file    Inability: Not on file  . Transportation needs:    Medical: Not on file    Non-medical: Not on file  Tobacco Use  . Smoking status: Never Smoker  . Smokeless tobacco: Never Used  Substance and Sexual Activity  . Alcohol use: Yes    Comment: RARE  . Drug use: No  . Sexual activity: Yes    Birth control/protection: IUD    Comment: Mirena   Lifestyle  . Physical activity:    Days per week: Not on file    Minutes per session: Not on file  . Stress: Not on file  Relationships  . Social connections:    Talks on phone: Not on file    Gets together: Not on file    Attends religious service: Not on file    Active member of club or organization: Not on file    Attends meetings of clubs or organizations: Not on file  Relationship status: Not on file  . Intimate partner violence:    Fear of current or ex partner: Not on file    Emotionally abused: Not on file    Physically abused: Not on file    Forced sexual activity: Not on file  Other Topics Concern  . Not on file  Social History Narrative   Lives w/ mother    Outpatient Medications Prior to Visit  Medication Sig Dispense Refill  . clindamycin (CLEOCIN) 2 % vaginal cream Insert 1 applicatorful vaginally once wkly for 3 months as maintenance 40 g 2  . lubiprostone (AMITIZA) 8 MCG capsule Take 1 capsule (8 mcg total) by mouth 2 (two) times daily with a meal. 60 capsule 2  . levonorgestrel (MIRENA) 20 MCG/24HR IUD 1 Intra Uterine Device (1 each total) by Intrauterine route once for 1 dose. 1 each   . doxycycline (VIBRAMYCIN) 100 MG capsule Take 1 capsule (100 mg total) by mouth 2 (two) times daily.  14 capsule 0  . metroNIDAZOLE (FLAGYL) 500 MG tablet Take 1 tab BID for 7 days 14 tablet 0   No facility-administered medications prior to visit.       ROS:  Review of Systems  Constitutional: Negative for fever.  Gastrointestinal: Positive for constipation. Negative for blood in stool, diarrhea, nausea and vomiting.  Genitourinary: Positive for dysuria, pelvic pain, urgency and vaginal discharge. Negative for dyspareunia, flank pain, frequency, hematuria, vaginal bleeding and vaginal pain.  Musculoskeletal: Negative for back pain.  Skin: Negative for rash.   BREAST: No symptoms   OBJECTIVE:   Vitals:  BP 108/60   Pulse 88   Ht 5\' 4"  (1.626 m)   Wt 164 lb (74.4 kg)   BMI 28.15 kg/m   Physical Exam  Constitutional: She is oriented to person, place, and time. Vital signs are normal. She appears well-developed.  Pulmonary/Chest: Effort normal.  Genitourinary: Uterus normal. There is no rash, tenderness or lesion on the right labia. There is no rash, tenderness or lesion on the left labia. Uterus is not enlarged and not tender. Cervix exhibits no motion tenderness. Right adnexum displays no mass and no tenderness. Left adnexum displays no mass and no tenderness. There is bleeding in the vagina. No erythema or tenderness in the vagina. No vaginal discharge found.  Genitourinary Comments: IUD STRINGS IN CX OS  Musculoskeletal: Normal range of motion.  Neurological: She is alert and oriented to person, place, and time.  Psychiatric: She has a normal mood and affect. Her behavior is normal. Thought content normal.  Vitals reviewed.   Results: Results for orders placed or performed in visit on 06/30/18 (from the past 24 hour(s))  POCT Wet Prep with KOH     Status: Normal   Collection Time: 06/30/18  9:34 AM  Result Value Ref Range   Trichomonas, UA Negative    Clue Cells Wet Prep HPF POC neg    Epithelial Wet Prep HPF POC     Yeast Wet Prep HPF POC neg    Bacteria Wet Prep  HPF POC     RBC Wet Prep HPF POC     WBC Wet Prep HPF POC     KOH Prep POC Negative Negative  POCT Urinalysis Dipstick     Status: Abnormal   Collection Time: 06/30/18  9:38 AM  Result Value Ref Range   Color, UA straw    Clarity, UA clear    Glucose, UA Negative Negative   Bilirubin, UA neg  Ketones, UA neg    Spec Grav, UA 1.015 1.010 - 1.025   Blood, UA mod    pH, UA 6.0 5.0 - 8.0   Protein, UA Negative Negative   Urobilinogen, UA     Nitrite, UA neg    Leukocytes, UA Negative Negative   Appearance     Odor     Having vag bleeding.  Assessment/Plan: Acute vaginitis - Pos sx/neg wet prep. Sx c/w AV. Rx RF avelox. F/u prn. Cont probiotics/condoms. - Plan: POCT Wet Prep with KOH, moxifloxacin (AVELOX) 400 MG tablet  Urinary urgency - Essent neg UA. Hematuria most likely from vag bleeding. Check C&S. Will f/u if pos. F/u prn.  - Plan: POCT Urinalysis Dipstick, Urine Culture    Meds ordered this encounter  Medications  . moxifloxacin (AVELOX) 400 MG tablet    Sig: Take 1 tablet (400 mg total) by mouth daily for 6 days.    Dispense:  6 tablet    Refill:  0    Order Specific Question:   Supervising Provider    Answer:   Nadara Mustard [[id number]]      Return if symptoms worsen or fail to improve.  Sosie Gato B. Yama Nielson, PA-C 06/30/2018 9:40 AM

## 2018-06-30 NOTE — Patient Instructions (Signed)
I value your feedback and entrusting us with your care. If you get a [place] patient survey, I would appreciate you taking the time to let us know about your experience today. Thank you! 

## 2018-07-02 LAB — URINE CULTURE

## 2018-08-11 ENCOUNTER — Encounter: Payer: Self-pay | Admitting: Obstetrics and Gynecology

## 2018-08-12 ENCOUNTER — Other Ambulatory Visit: Payer: Self-pay | Admitting: Obstetrics and Gynecology

## 2018-08-12 DIAGNOSIS — B9689 Other specified bacterial agents as the cause of diseases classified elsewhere: Secondary | ICD-10-CM

## 2018-08-12 DIAGNOSIS — N760 Acute vaginitis: Principal | ICD-10-CM

## 2018-08-12 MED ORDER — FLUCONAZOLE 150 MG PO TABS: 150.0000 mg | ORAL_TABLET | Freq: Once | ORAL | 0 refills | Status: DC

## 2018-08-12 MED ORDER — CLINDAMYCIN PHOSPHATE 2 % VA CREA: 1.0000 | TOPICAL_CREAM | Freq: Every day | VAGINAL | 0 refills | Status: AC

## 2018-08-12 NOTE — Progress Notes (Signed)
Rx Rf cleocin due to BV sx. Add boric acid supp afterwards.

## 2018-08-12 NOTE — Progress Notes (Signed)
Rx for yeast vag sx.  

## 2018-08-17 ENCOUNTER — Ambulatory Visit: Payer: Managed Care, Other (non HMO) | Admitting: Obstetrics and Gynecology

## 2018-08-26 ENCOUNTER — Encounter: Payer: Self-pay | Admitting: Obstetrics & Gynecology

## 2018-08-26 ENCOUNTER — Ambulatory Visit (INDEPENDENT_AMBULATORY_CARE_PROVIDER_SITE_OTHER): Payer: Managed Care, Other (non HMO) | Admitting: Obstetrics & Gynecology

## 2018-08-26 VITALS — BP 102/70 | Ht 64.0 in | Wt 170.0 lb

## 2018-08-26 DIAGNOSIS — R87810 Cervical high risk human papillomavirus (HPV) DNA test positive: Secondary | ICD-10-CM

## 2018-08-26 DIAGNOSIS — R87610 Atypical squamous cells of undetermined significance on cytologic smear of cervix (ASC-US): Secondary | ICD-10-CM

## 2018-08-26 NOTE — Progress Notes (Signed)
  HPI:  Patient is a 27 y.o. G0P0000 presenting for follow up evaluation of abnormal PAP smear in the past.  Her last PAP was 6 months ago and was abnormal: ASCUS. She has had a prior colposcopy. Prior biopsies (if done) were Normal.  No recent BV or other sx's other than urinary freq.  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 Also,  has a past surgical history that includes uterus tacked (2012); Esophagogastroduodenoscopy (04/02/12); Appendectomy (2012); Intrauterine device (iud) insertion; ovarian cyst removed; and Esophagogastroduodenoscopy (egd) with propofol (N/A, 08/07/2015)., family history includes Breast cancer in her maternal grandmother; Breast cancer (age of onset: 3255) in her maternal aunt; Hypertension in her mother.,  reports that she has never smoked. She has never used smokeless tobacco. She reports current alcohol use. She reports that she does not use drugs.  She has a current medication list which includes the following prescription(s): levonorgestrel and lubiprostone. Also, is allergic to dexlansoprazole.  Review of Systems  All other systems reviewed and are negative.  UA- Neg  Objective: BP 102/70   Ht 5\' 4"  (1.626 m)   Wt 170 lb (77.1 kg)   BMI 29.18 kg/m  Filed Weights   08/26/18 0855  Weight: 170 lb (77.1 kg)   Body mass index is 29.18 kg/m.  Physical examination Physical Exam Constitutional:      General: She is not in acute distress.    Appearance: She is well-developed.  Genitourinary:     Pelvic exam was performed with patient supine.     Vagina and uterus normal.     No vaginal erythema or bleeding.     No cervical motion tenderness, discharge, polyp or nabothian cyst.     Uterus is  mobile.     Uterus is not enlarged.     No uterine mass detected.    Uterus is midaxial.     No right or left adnexal mass present.     Right adnexa not tender.     Left adnexa not tender.  HENT:     Head: Normocephalic and atraumatic.     Nose: Nose normal.  Abdominal:     General: There is no distension.     Palpations: Abdomen is soft.     Tenderness: There is no abdominal tenderness.  Musculoskeletal: Normal range of motion.  Neurological:     Mental Status: She is alert and oriented to person, place, and time.     Cranial Nerves: No cranial nerve deficit.  Skin:    General: Skin is warm and dry.   ASSESSMENT:  History of Cervical Dysplasia  Plan:  1.  I discussed the grading system of pap smears and HPV high risk viral types.   2. Follow up PAP 6 months, vs intervention if high grade dysplasia identified. 3. Treatment of persistantly abnormal PAP smears and cervical dysplasia, even mild, is discussed w pt today in detail, as well as the pros and cons of Cryo and LEEP procedures. Will consider and discuss after results.   Annamarie MajorPaul Krayton Wortley, MD, Merlinda FrederickFACOG [facility], [facility] 08/26/2018  9:30 AM

## 2018-08-26 NOTE — Addendum Note (Signed)
Addended by: Nadara MustardHARRIS, Tonianne Fine P on: 08/26/2018 09:58 AM   Modules accepted: Orders

## 2018-08-31 ENCOUNTER — Encounter: Payer: Self-pay | Admitting: Obstetrics & Gynecology

## 2018-08-31 LAB — PAP IG (IMAGE GUIDED)

## 2018-10-15 ENCOUNTER — Encounter: Payer: Self-pay | Admitting: Obstetrics and Gynecology

## 2018-10-16 ENCOUNTER — Encounter: Payer: Self-pay | Admitting: Obstetrics and Gynecology

## 2018-10-16 ENCOUNTER — Other Ambulatory Visit: Payer: Self-pay | Admitting: Obstetrics and Gynecology

## 2018-10-16 DIAGNOSIS — B9689 Other specified bacterial agents as the cause of diseases classified elsewhere: Secondary | ICD-10-CM

## 2018-10-16 DIAGNOSIS — N760 Acute vaginitis: Principal | ICD-10-CM

## 2018-10-16 MED ORDER — METRONIDAZOLE 500 MG PO TABS: ORAL_TABLET | ORAL | 0 refills | Status: DC

## 2018-10-20 ENCOUNTER — Encounter: Payer: Self-pay | Admitting: Obstetrics and Gynecology

## 2018-10-20 ENCOUNTER — Ambulatory Visit (INDEPENDENT_AMBULATORY_CARE_PROVIDER_SITE_OTHER): Payer: Managed Care, Other (non HMO) | Admitting: Obstetrics and Gynecology

## 2018-10-20 VITALS — BP 110/80 | HR 82 | Ht 64.0 in | Wt 170.0 lb

## 2018-10-20 DIAGNOSIS — Z113 Encounter for screening for infections with a predominantly sexual mode of transmission: Secondary | ICD-10-CM | POA: Diagnosis not present

## 2018-10-20 DIAGNOSIS — R350 Frequency of micturition: Secondary | ICD-10-CM | POA: Diagnosis not present

## 2018-10-20 DIAGNOSIS — N898 Other specified noninflammatory disorders of vagina: Secondary | ICD-10-CM | POA: Diagnosis not present

## 2018-10-20 LAB — POCT WET PREP WITH KOH
Clue Cells Wet Prep HPF POC: NEGATIVE
KOH PREP POC: NEGATIVE
Trichomonas, UA: NEGATIVE
Yeast Wet Prep HPF POC: NEGATIVE

## 2018-10-20 LAB — POCT URINALYSIS DIPSTICK
Bilirubin, UA: NEGATIVE
Blood, UA: NEGATIVE
Glucose, UA: NEGATIVE
Ketones, UA: NEGATIVE
Leukocytes, UA: NEGATIVE
NITRITE UA: NEGATIVE
Protein, UA: NEGATIVE
Spec Grav, UA: 1.01 (ref 1.010–1.025)
pH, UA: 6 (ref 5.0–8.0)

## 2018-10-20 MED ORDER — MOXIFLOXACIN HCL 400 MG PO TABS: 400.0000 mg | ORAL_TABLET | Freq: Every day | ORAL | 0 refills | Status: DC

## 2018-10-20 NOTE — Progress Notes (Addendum)
Patient, No Pcp Per   Chief Complaint  Patient presents with  . Vaginal Discharge    discharge without odor, no itchiness or irritation, is having the urge to urinate at times, sometimes urinates and others she doesnt, no blood noticed x 1 week  . [employer] Employee    HPI:      [name] is a 28 y.o. G0P0000 who LMP was No LMP recorded. (Menstrual status: IUD)., presents today for increased vag d/c without fishy odor/irritaton for the past wk. D/C is excessive and different, maybe with a sour smell, clear to white. Tried flagyl last wk without sx change. Pt with hx of BV and AV. Does cleocin vag crm once wkly as preventive, takes probiotics, has done boric acid off and on for the past month or so, although feels like it is more irritating recently.  Pt also with urinary frequency/urgency for the same amount of time, without dysuria, hematuria, LBP, belly pain, fevers. She is sex active with new partner, using condoms. Wants STD testing.  Current on annual.  Past Medical History:  Diagnosis Date  . Abnormal Pap smear of cervix 2016  . 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 Chest pain   . Constipation   . Dysphagia   . Eosinophilic gastroenteritis   . GERD (gastroesophageal reflux disease)   . History of hiatal hernia   . Ovarian cyst   . UTI (urinary tract infection)     Past Surgical History:  Procedure Laterality Date  . APPENDECTOMY  2012   [facility]Dr [email or web address]  . ESOPHAGOGASTRODUODENOSCOPY  04/02/12   Fields-proximal esophageal web, small hiatal hernia, mild non-H. pylori gastritis, 12.8-16mm savory dilation  . ESOPHAGOGASTRODUODENOSCOPY (EGD) WITH PROPOFOL N/A 08/07/2015   Procedure: ESOPHAGOGASTRODUODENOSCOPY (EGD) WITH PROPOFOL;  Surgeon: Christena Deem, MD;  Location: [facility] ENDOSCOPY;  Service: Endoscopy;  Laterality: N/A;  . INTRAUTERINE DEVICE (IUD) INSERTION    . ovarian cyst removed    . uterus tacked  2012    Family History  Problem  Relation Age of Onset  . Hypertension Mother   . Breast cancer Maternal Grandmother        ? age of dx  . Breast cancer Maternal Aunt 55  . Diabetes Maternal Aunt        Type 2  . Hypertension Maternal Aunt     Social History   Socioeconomic History  . Marital status: Single    Spouse name: Not on file  . Number of children: 0  . Years of education: Not on file  . Highest education level: Not on file  Occupational History    Employer: [employer]  Social Needs  . Financial resource strain: Not on file  . Food insecurity:    Worry: Not on file    Inability: Not on file  . Transportation needs:    Medical: Not on file    Non-medical: Not on file  Tobacco Use  . Smoking status: Never Smoker  . Smokeless tobacco: Never Used  Substance and Sexual Activity  . Alcohol use: Yes    Comment: RARE  . Drug use: No  . Sexual activity: Yes    Birth control/protection: I.U.D.    Comment: Mirena   Lifestyle  . Physical activity:    Days per week: Not on file    Minutes per session: Not on file  . Stress: Not on file  Relationships  . Social connections:    Talks on phone:  Not on file    Gets together: Not on file    Attends religious service: Not on file    Active member of club or organization: Not on file    Attends meetings of clubs or organizations: Not on file    Relationship status: Not on file  . Intimate partner violence:    Fear of current or ex partner: Not on file    Emotionally abused: Not on file    Physically abused: Not on file    Forced sexual activity: Not on file  Other Topics Concern  . Not on file  Social History Narrative   Lives w/ mother    Outpatient Medications Prior to Visit  Medication Sig Dispense Refill  . clindamycin (CLEOCIN) 2 % vaginal cream     . levonorgestrel (MIRENA) 20 MCG/24HR IUD 1 each by Intrauterine route once.    . metroNIDAZOLE (FLAGYL) 500 MG tablet Take 1 tab BID for 7 days 14 tablet 0  . lubiprostone (AMITIZA) 8 MCG  capsule Take 1 capsule (8 mcg total) by mouth 2 (two) times daily with a meal. (Patient not taking: Reported on 10/20/2018) 60 capsule 2   No facility-administered medications prior to visit.       ROS:  Review of Systems  Constitutional: Negative for fatigue, fever and unexpected weight change.  Respiratory: Negative for cough, shortness of breath and wheezing.   Cardiovascular: Negative for chest pain, palpitations and leg swelling.  Gastrointestinal: Negative for blood in stool, constipation, diarrhea, nausea and vomiting.  Endocrine: Negative for cold intolerance, heat intolerance and polyuria.  Genitourinary: Positive for frequency, urgency and vaginal discharge. Negative for dyspareunia, dysuria, flank pain, genital sores, hematuria, menstrual problem, pelvic pain, vaginal bleeding and vaginal pain.  Musculoskeletal: Negative for back pain, joint swelling and myalgias.  Skin: Negative for rash.  Neurological: Negative for dizziness, syncope, light-headedness, numbness and headaches.  Hematological: Negative for adenopathy.  Psychiatric/Behavioral: Negative for agitation, confusion, sleep disturbance and suicidal ideas. The patient is not nervous/anxious.      OBJECTIVE:   Vitals:  BP 110/80   Pulse 82   Ht 5\' 4"  (1.626 m)   Wt 170 lb (77.1 kg)   BMI 29.18 kg/m   Physical Exam Vitals signs reviewed.  Constitutional:      Appearance: She is well-developed.  Pulmonary:     Effort: Pulmonary effort is normal.  Genitourinary:    Pubic Area: No rash.      Labia:        Right: No rash, tenderness or lesion.        Left: No rash, tenderness or lesion.      Vagina: Vaginal discharge present. No erythema or tenderness.     Cervix: Normal.     Uterus: Normal. Not enlarged and not tender.      Adnexa: Right adnexa normal and left adnexa normal.       Right: No mass or tenderness.         Left: No mass or tenderness.       Comments: IUD STRINGS IN PLACE Musculoskeletal:  Normal range of motion.  Neurological:     Mental Status: She is alert and oriented to person, place, and time.  Psychiatric:        Behavior: Behavior normal.        Thought Content: Thought content normal.     Results: Results for orders placed or performed in visit on 10/20/18 (from the past 24 hour(s))  POCT Wet Prep  with KOH     Status: Normal   Collection Time: 10/20/18  4:56 PM  Result Value Ref Range   Trichomonas, UA Negative    Clue Cells Wet Prep HPF POC neg    Epithelial Wet Prep HPF POC     Yeast Wet Prep HPF POC neg    Bacteria Wet Prep HPF POC     RBC Wet Prep HPF POC     WBC Wet Prep HPF POC     KOH Prep POC Negative Negative  POCT Urinalysis Dipstick     Status: Normal   Collection Time: 10/20/18  4:56 PM  Result Value Ref Range   Color, UA yellow    Clarity, UA clear    Glucose, UA Negative Negative   Bilirubin, UA neg    Ketones, UA neg    Spec Grav, UA 1.010 1.010 - 1.025   Blood, UA neg    pH, UA 6.0 5.0 - 8.0   Protein, UA Negative Negative   Urobilinogen, UA     Nitrite, UA neg    Leukocytes, UA Negative Negative   Appearance     Odor       Assessment/Plan: Vaginal discharge - Neg wet prep/exam. Question AV. Rx avelox. Check STD. If neg/no sx change, then prob normal physiologic d/c. Can always do another One Swab - Plan: moxifloxacin (AVELOX) 400 MG tablet, POCT Wet Prep with KOH, Chlamydia/Gonococcus/Trichomonas, NAA, CANCELED: Cervicovaginal ancillary only  Urinary frequency - Neg UA. Check C&S. Will f/u with results.  - Plan: POCT Urinalysis Dipstick, Urine Culture  Screening for STD (sexually transmitted disease) - Plan: Chlamydia/Gonococcus/Trichomonas, NAA, CANCELED: Cervicovaginal ancillary only    Meds ordered this encounter  Medications  . moxifloxacin (AVELOX) 400 MG tablet    Sig: Take 1 tablet (400 mg total) by mouth daily for 6 days.    Dispense:  6 tablet    Refill:  0    Order Specific Question:   Supervising Provider     Answer:   Nadara MustardHARRIS, ROBERT P [[id number]]      Return if symptoms worsen or fail to improve.  Seraya Jobst B. Damiean Lukes, PA-C 10/20/2018 5:01 PM

## 2018-10-20 NOTE — Patient Instructions (Signed)
I value your feedback and entrusting us with your care. If you get a [place] patient survey, I would appreciate you taking the time to let us know about your experience today. Thank you! 

## 2018-10-20 NOTE — Addendum Note (Signed)
Addended by: Althea Grimmer B on: 10/20/2018 05:01 PM   Modules accepted: Orders

## 2018-10-23 ENCOUNTER — Encounter: Payer: Self-pay | Admitting: Obstetrics and Gynecology

## 2018-10-23 LAB — URINE CULTURE: Organism ID, Bacteria: NO GROWTH

## 2018-10-24 LAB — CHLAMYDIA/GONOCOCCUS/TRICHOMONAS, NAA
CHLAMYDIA BY NAA: NEGATIVE
Gonococcus by NAA: NEGATIVE
Trich vag by NAA: NEGATIVE

## 2018-10-26 ENCOUNTER — Encounter: Payer: Self-pay | Admitting: Obstetrics and Gynecology

## 2018-11-04 ENCOUNTER — Encounter: Payer: Self-pay | Admitting: Obstetrics and Gynecology

## 2018-11-04 ENCOUNTER — Ambulatory Visit: Payer: Managed Care, Other (non HMO) | Admitting: Obstetrics and Gynecology

## 2018-11-04 NOTE — Telephone Encounter (Signed)
Pls pt pt on my schedule for UA with Gracie. Thx

## 2018-11-05 ENCOUNTER — Ambulatory Visit (INDEPENDENT_AMBULATORY_CARE_PROVIDER_SITE_OTHER): Payer: Managed Care, Other (non HMO) | Admitting: Obstetrics and Gynecology

## 2018-11-05 DIAGNOSIS — R3915 Urgency of urination: Secondary | ICD-10-CM

## 2018-11-05 DIAGNOSIS — R350 Frequency of micturition: Secondary | ICD-10-CM

## 2018-11-05 LAB — POCT URINALYSIS DIPSTICK
Bilirubin, UA: NEGATIVE
Blood, UA: NEGATIVE
Glucose, UA: NEGATIVE
Ketones, UA: NEGATIVE
Leukocytes, UA: NEGATIVE
NITRITE UA: NEGATIVE
PH UA: 5 (ref 5.0–8.0)
Protein, UA: POSITIVE — AB
Urobilinogen, UA: 0.2 E.U./dL

## 2018-11-05 NOTE — Progress Notes (Signed)
Pt with continued frequency, urgency, for over 2 wks; now with dysuria for a few days. Had neg C&S and STD testing 10/21/18. Treated for BV and AV with flagyl, avelox respectively without sx change. Pt with long hx of recurrent vaginitis. Uses cleocin crm once wkly as preventive. Still has some vag d/c.  Neg UA again today. Pt still with sx. Refer to urology for further eval.  Results for orders placed or performed in visit on 11/05/18 (from the past 24 hour(s))  POCT Urinalysis Dipstick     Status: Abnormal   Collection Time: 11/05/18  8:56 AM  Result Value Ref Range   Color, UA     Clarity, UA     Glucose, UA Negative Negative   Bilirubin, UA neg    Ketones, UA neg    Spec Grav, UA     Blood, UA neg    pH, UA 5.0 5.0 - 8.0   Protein, UA Positive (A) Negative   Urobilinogen, UA 0.2 0.2 or 1.0 E.U./dL   Nitrite, UA neg    Leukocytes, UA Negative Negative   Appearance     Odor

## 2018-11-10 ENCOUNTER — Encounter (INDEPENDENT_AMBULATORY_CARE_PROVIDER_SITE_OTHER): Payer: Managed Care, Other (non HMO) | Admitting: Obstetrics and Gynecology

## 2018-11-10 DIAGNOSIS — N898 Other specified noninflammatory disorders of vagina: Secondary | ICD-10-CM | POA: Diagnosis not present

## 2018-11-10 NOTE — Patient Instructions (Signed)
I value your feedback and entrusting us with your care. If you get a [place] patient survey, I would appreciate you taking the time to let us know about your experience today. Thank you! 

## 2018-11-11 ENCOUNTER — Other Ambulatory Visit: Payer: Self-pay | Admitting: Obstetrics and Gynecology

## 2018-11-11 MED ORDER — FLUCONAZOLE 150 MG PO TABS: 150.0000 mg | ORAL_TABLET | Freq: Once | ORAL | 0 refills | Status: DC

## 2018-11-11 NOTE — Progress Notes (Signed)
Rx diflucan for yeast vag sx, after taking avelox for AV sx. On probiotics.

## 2018-11-18 ENCOUNTER — Other Ambulatory Visit: Payer: Self-pay | Admitting: Obstetrics and Gynecology

## 2018-11-18 LAB — POCT WET PREP WITH KOH
Clue Cells Wet Prep HPF POC: POSITIVE
KOH Prep POC: POSITIVE — AB
Trichomonas, UA: NEGATIVE
Yeast Wet Prep HPF POC: NEGATIVE

## 2018-11-18 MED ORDER — CLINDAMYCIN HCL 300 MG PO CAPS: 300.0000 mg | ORAL_CAPSULE | Freq: Two times a day (BID) | ORAL | 0 refills | Status: AC

## 2018-11-18 MED ORDER — CLINDAMYCIN PHOSPHATE 2 % VA CREA: 1.0000 | TOPICAL_CREAM | Freq: Every day | VAGINAL | 0 refills | Status: DC

## 2018-11-18 NOTE — Telephone Encounter (Signed)
Pt with cont vaginal d/c, no odor/irritation. We have treated with flagyl, avelox and diflucan since 2/20 without sx change. Pt uses clindamycin QWK as BV preventive. Hx of recurrent BV and AV in past (identified on one swab). Neg STD testing 2/20. No new sex partners. Uses condoms but they can be irritating. Suggested spermicide free. Takes probiotics, boric acid sometimes, causes burning. Uses water to clear, no dryer sheets, no vag wipes, cotton underwear, no thongs. Pt's urinary urgency is a little improved. Had 2 neg C&S. Has urology appt 4/20. No relief with AZO. No caffeine use.   Results for orders placed or performed in visit on 11/10/18 (from the past 24 hour(s))  POCT Wet Prep with KOH     Status: Abnormal   Collection Time: 11/18/18 11:29 AM  Result Value Ref Range   Trichomonas, UA Negative    Clue Cells Wet Prep HPF POC pos    Epithelial Wet Prep HPF POC     Yeast Wet Prep HPF POC neg    Bacteria Wet Prep HPF POC     RBC Wet Prep HPF POC     WBC Wet Prep HPF POC     KOH Prep POC Positive (A) Negative   Rx clindamycin. Check One swab AV, BV and yeast. Will f/u with results.  N/C to pt since having to come in so frequently with sx that aren't improving.

## 2018-11-18 NOTE — Telephone Encounter (Signed)
FYI. Please give her one swab qtip and wet prep qtip. I'll check wet prep while she is here, so dont' let her leave. Visit Will be N/C to pt. Thx.

## 2018-11-18 NOTE — Progress Notes (Signed)
Rx RF cleocin crm since clindamycin capsules too expensive.

## 2018-11-23 MED ORDER — MOXIFLOXACIN HCL 400 MG PO TABS: 400.0000 mg | ORAL_TABLET | Freq: Every day | ORAL | 0 refills | Status: DC

## 2018-11-23 NOTE — Addendum Note (Signed)
Addended by: Althea Grimmer B on: 11/23/2018 12:20 PM   Modules accepted: Orders

## 2018-12-10 ENCOUNTER — Ambulatory Visit: Payer: Self-pay | Admitting: Urology

## 2018-12-11 ENCOUNTER — Encounter: Payer: Self-pay | Admitting: Obstetrics and Gynecology

## 2018-12-14 ENCOUNTER — Other Ambulatory Visit: Payer: Self-pay | Admitting: Obstetrics and Gynecology

## 2018-12-14 DIAGNOSIS — N760 Acute vaginitis: Secondary | ICD-10-CM

## 2018-12-14 MED ORDER — MOXIFLOXACIN HCL 400 MG PO TABS: 400.0000 mg | ORAL_TABLET | Freq: Every day | ORAL | 0 refills | Status: AC

## 2018-12-14 NOTE — Progress Notes (Signed)
Rx RF avelox due to recurrent sx.

## 2019-01-28 ENCOUNTER — Other Ambulatory Visit: Payer: Self-pay | Admitting: Obstetrics and Gynecology

## 2019-01-28 ENCOUNTER — Encounter: Payer: Self-pay | Admitting: Obstetrics and Gynecology

## 2019-01-28 MED ORDER — MOXIFLOXACIN HCL 400 MG PO TABS: 400.0000 mg | ORAL_TABLET | Freq: Every day | ORAL | 0 refills | Status: AC

## 2019-01-28 NOTE — Progress Notes (Signed)
Rx RF avelox for AV sx.  

## 2019-02-10 ENCOUNTER — Encounter: Payer: Self-pay | Admitting: Obstetrics and Gynecology

## 2019-02-11 ENCOUNTER — Other Ambulatory Visit: Payer: Self-pay

## 2019-02-11 ENCOUNTER — Ambulatory Visit (INDEPENDENT_AMBULATORY_CARE_PROVIDER_SITE_OTHER): Payer: Managed Care, Other (non HMO) | Admitting: Obstetrics and Gynecology

## 2019-02-11 ENCOUNTER — Encounter: Payer: Self-pay | Admitting: Obstetrics and Gynecology

## 2019-02-11 VITALS — BP 110/70 | Ht 64.0 in | Wt 160.8 lb

## 2019-02-11 DIAGNOSIS — Z113 Encounter for screening for infections with a predominantly sexual mode of transmission: Secondary | ICD-10-CM | POA: Diagnosis not present

## 2019-02-11 DIAGNOSIS — B373 Candidiasis of vulva and vagina: Secondary | ICD-10-CM | POA: Diagnosis not present

## 2019-02-11 DIAGNOSIS — B3731 Acute candidiasis of vulva and vagina: Secondary | ICD-10-CM

## 2019-02-11 LAB — POCT WET PREP WITH KOH
Clue Cells Wet Prep HPF POC: NEGATIVE
KOH Prep POC: NEGATIVE
Trichomonas, UA: NEGATIVE
Yeast Wet Prep HPF POC: POSITIVE

## 2019-02-11 MED ORDER — FLUCONAZOLE 150 MG PO TABS: ORAL_TABLET | ORAL | 0 refills | Status: DC

## 2019-02-11 NOTE — Patient Instructions (Signed)
I value your feedback and entrusting us with your care. If you get a [place] patient survey, I would appreciate you taking the time to let us know about your experience today. Thank you! 

## 2019-02-11 NOTE — Progress Notes (Signed)
Patient, No Pcp Per   Chief Complaint  Patient presents with  . Vaginal Discharge    itchiness/irritation, no odor x 4 days  . [employer] Employee    HPI:      [name] is a 28 y.o. G0P0000 who LMP was No LMP recorded. (Menstrual status: IUD)., presents today for vaginal irritation with increased d/c, no odor, for 4 days. Treated last wk with avelox for sx consistent with AV. Pt with long hx (almost 2 yrs) of recurrent BV and AV (confirmed on One Swab culture) and then subsequent yeast vag. Pt using clindamycin gel once wkly as BV (apotobium on culture) prevention, probiotics, and boric acid supp. Pt tried abstinence for a month without sx change. Pt with IUD and she wonders if causing sx, but had BV in past prior to IUD, just not as frequent. Uses dove sens skin soap, no dryer sheets, uses unscented detergent and wear cotton underwear. Pt is wearing pantyliners daily due to irreg bleeding with IUD, and doesn't change them frequently. Question trigger of vaginitis sx. Pt VERY frustrated with recurrent sx and I'm frustrated for her. We have exhausted treatments and preventive measures.    Pt is sex active, no new partners, but [facility] STD [facility].   Past Medical History:  Diagnosis Date  . Abnormal Pap smear of cervix 2016  . 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 Chest pain   . Constipation   . Dysphagia   . Eosinophilic gastroenteritis   . GERD (gastroesophageal reflux disease)   . History of hiatal hernia   . Ovarian cyst   . UTI (urinary tract infection)     Past Surgical History:  Procedure Laterality Date  . APPENDECTOMY  2012   [facility]Dr [id number]  . ESOPHAGOGASTRODUODENOSCOPY  04/02/12   Fields-proximal esophageal web, small hiatal hernia, mild non-H. pylori gastritis, 12.8-16mm savory dilation  . ESOPHAGOGASTRODUODENOSCOPY (EGD) WITH PROPOFOL N/A 08/07/2015   Procedure: ESOPHAGOGASTRODUODENOSCOPY (EGD) WITH PROPOFOL;  Surgeon:  Christena DeemMartin U Skulskie, MD;  [facility];  Service: Endoscopy;  Laterality: N/A;  . INTRAUTERINE DEVICE (IUD) INSERTION    . ovarian cyst removed    . uterus tacked  2012    Family History  Problem Relation Age of Onset  . Hypertension Mother   . Breast cancer Maternal Grandmother        ? age of dx  . Breast cancer Maternal Aunt 55  . Diabetes Maternal Aunt        Type 2  . Hypertension Maternal Aunt     Social History   Socioeconomic History  . Marital status: Single    Spouse name: Not on file  . Number of children: 0  . Years of education: Not on file  . Highest education level: Not on file  Occupational History    Employer: [employer]  Social Needs  . Financial resource strain: Not on file  . Food insecurity:    Worry: Not on file    Inability: Not on file  . Transportation needs:    Medical: Not on file    Non-medical: Not on file  Tobacco Use  . Smoking status: Never Smoker  . Smokeless tobacco: Never Used  Substance and Sexual Activity  . Alcohol use: Yes    Comment: RARE  . Drug use: No  . Sexual activity: Yes    Birth control/protection: I.U.D.    Comment: Mirena   Lifestyle  . Physical  activity:    Days per week: Not on file    Minutes per session: Not on file  . Stress: Not on file  Relationships  . Social connections:    Talks on phone: Not on file    Gets together: Not on file    Attends religious service: Not on file    Active member of club or organization: Not on file    Attends meetings of clubs or organizations: Not on file    Relationship status: Not on file  . Intimate partner violence:    Fear of current or ex partner: Not on file    Emotionally abused: Not on file    Physically abused: Not on file    Forced sexual activity: Not on file  Other Topics Concern  . Not on file  Social History Narrative   Lives w/ mother    Outpatient Medications Prior to Visit  Medication Sig Dispense Refill  . clindamycin (CLEOCIN) 2  % vaginal cream Place 1 Applicatorful vaginally at bedtime. 40 g 0  . levonorgestrel (MIRENA) 20 MCG/24HR IUD 1 each by Intrauterine route once.    . lubiprostone (AMITIZA) 8 MCG capsule Take 1 capsule (8 mcg total) by mouth 2 (two) times daily with a meal. (Patient not taking: Reported on 10/20/2018) 60 capsule 2  . metroNIDAZOLE (FLAGYL) 500 MG tablet Take 1 tab BID for 7 days 14 tablet 0   No facility-administered medications prior to visit.       ROS:  Review of Systems  Constitutional: Negative for fever.  Gastrointestinal: Negative for blood in stool, constipation, diarrhea, nausea and vomiting.  Genitourinary: Positive for vaginal discharge. Negative for dyspareunia, dysuria, flank pain, frequency, hematuria, urgency, vaginal bleeding and vaginal pain.  Musculoskeletal: Negative for back pain.  Skin: Negative for rash.    OBJECTIVE:   Vitals:  BP 110/70   Ht 5\' 4"  (1.626 m)   Wt 160 lb 12.8 oz (72.9 kg)   BMI 27.60 kg/m   Physical Exam Vitals signs reviewed.  Constitutional:      Appearance: She is well-developed.  Neck:     Musculoskeletal: Normal range of motion.  Pulmonary:     Effort: Pulmonary effort is normal.  Genitourinary:    General: Normal vulva.     Pubic Area: No rash.      Labia:        Right: Tenderness present. No rash or lesion.        Left: Tenderness present. No rash or lesion.      Vagina: Vaginal discharge present. No erythema or tenderness.     Cervix: Normal.     Uterus: Normal. Not enlarged and not tender.      Adnexa: Right adnexa normal and left adnexa normal.       Right: No mass or tenderness.         Left: No mass or tenderness.       Comments: VULVAR ERYTHEMA/WHITE D/C Musculoskeletal: Normal range of motion.  Skin:    General: Skin is warm and dry.  Neurological:     General: No focal deficit present.     Mental Status: She is alert and oriented to person, place, and time.  Psychiatric:        Mood and Affect: Mood normal.         Behavior: Behavior normal.        Thought Content: Thought content normal.        Judgment: Judgment normal.  Results: Results for orders placed or performed in visit on 02/11/19 (from the past 24 hour(s))  POCT Wet Prep with KOH     Status: Abnormal   Collection Time: 02/11/19 10:00 AM  Result Value Ref Range   Trichomonas, UA Negative    Clue Cells Wet Prep HPF POC neg    Epithelial Wet Prep HPF POC     Yeast Wet Prep HPF POC POS    Bacteria Wet Prep HPF POC     RBC Wet Prep HPF POC     WBC Wet Prep HPF POC     KOH Prep POC Negative Negative     Assessment/Plan: Candidal vaginitis - Pos sx/wet prep. Rx diflucan. Treat Q4 days for several wks as preventive given recurrent BV and yeast issues. Check culture. Will f/u with results. - Plan: fluconazole (DIFLUCAN) 150 MG tablet, NuSwab Vaginitis Plus (VG+), POCT Wet Prep with KOH, CANCELED: Cervicovaginal ancillary only  Screening for STD (sexually transmitted disease) - Plan: NuSwab Vaginitis Plus (VG+), CANCELED: Cervicovaginal ancillary only    Meds ordered this encounter  Medications  . fluconazole (DIFLUCAN) 150 MG tablet    Sig: Take 1 tab Q4 days for 3 wks    Dispense:  6 tablet    Refill:  0    Order Specific Question:   Supervising Provider    Answer:   Nadara Mustard [[id number]]   Suggested pt stop panytliners or change very frequently due to moisture. May need to remove IUD and go back to depo.    Return if symptoms worsen or fail to improve.   B. , PA-C 02/11/2019 10:03 AM

## 2019-02-14 ENCOUNTER — Encounter: Payer: Self-pay | Admitting: Obstetrics and Gynecology

## 2019-02-15 ENCOUNTER — Other Ambulatory Visit: Payer: Self-pay | Admitting: Obstetrics and Gynecology

## 2019-02-15 MED ORDER — TERCONAZOLE 0.4 % VA CREA: 1.0000 | TOPICAL_CREAM | Freq: Every day | VAGINAL | 0 refills | Status: DC

## 2019-02-15 NOTE — Progress Notes (Signed)
Rx terazol for yeast vag not improving with diflucan.

## 2019-02-20 LAB — NUSWAB VAGINITIS PLUS (VG+)
Candida albicans, NAA: POSITIVE — AB
Candida glabrata, NAA: NEGATIVE
Chlamydia trachomatis, NAA: NEGATIVE
Neisseria gonorrhoeae, NAA: NEGATIVE
Trich vag by NAA: NEGATIVE

## 2019-02-21 ENCOUNTER — Encounter: Payer: Self-pay | Admitting: Obstetrics and Gynecology

## 2019-02-26 ENCOUNTER — Ambulatory Visit: Payer: Managed Care, Other (non HMO) | Admitting: Obstetrics & Gynecology

## 2019-03-01 ENCOUNTER — Ambulatory Visit: Payer: Self-pay | Admitting: Urology

## 2019-03-02 ENCOUNTER — Other Ambulatory Visit: Payer: Self-pay | Admitting: Obstetrics and Gynecology

## 2019-03-02 DIAGNOSIS — B9689 Other specified bacterial agents as the cause of diseases classified elsewhere: Secondary | ICD-10-CM

## 2019-03-02 MED ORDER — METRONIDAZOLE 500 MG PO TABS: ORAL_TABLET | ORAL | 0 refills | Status: DC

## 2019-03-02 MED ORDER — METRONIDAZOLE 0.75 % VA GEL: VAGINAL | 1 refills | Status: DC

## 2019-03-02 NOTE — Progress Notes (Signed)
Rx RF flagyl for BV sx. Then start metrogel twice wkly as preventive.

## 2019-05-05 ENCOUNTER — Other Ambulatory Visit: Payer: Self-pay | Admitting: Student in an Organized Health Care Education/Training Program

## 2019-05-05 ENCOUNTER — Other Ambulatory Visit: Payer: Self-pay | Admitting: Obstetrics and Gynecology

## 2019-05-05 DIAGNOSIS — R1013 Epigastric pain: Secondary | ICD-10-CM

## 2019-05-06 ENCOUNTER — Other Ambulatory Visit: Payer: Self-pay

## 2019-05-06 ENCOUNTER — Encounter: Payer: Self-pay | Admitting: Obstetrics and Gynecology

## 2019-05-07 ENCOUNTER — Emergency Department (HOSPITAL_COMMUNITY): Payer: Managed Care, Other (non HMO)

## 2019-05-07 ENCOUNTER — Emergency Department (HOSPITAL_COMMUNITY)
Admission: EM | Admit: 2019-05-07 | Discharge: 2019-05-07 | Disposition: A | Discharge status: Home / Self Care | Payer: Managed Care, Other (non HMO) | Attending: Emergency Medicine | Admitting: Emergency Medicine

## 2019-05-07 ENCOUNTER — Encounter (HOSPITAL_COMMUNITY): Payer: Self-pay

## 2019-05-07 ENCOUNTER — Other Ambulatory Visit: Payer: Self-pay

## 2019-05-07 DIAGNOSIS — K859 Acute pancreatitis without necrosis or infection, unspecified: Secondary | ICD-10-CM | POA: Diagnosis not present

## 2019-05-07 DIAGNOSIS — R1013 Epigastric pain: Secondary | ICD-10-CM | POA: Diagnosis present

## 2019-05-07 LAB — CBC
HCT: 42.9 % (ref 36.0–46.0)
Hemoglobin: 14.1 g/dL (ref 12.0–15.0)
MCH: 31.5 pg (ref 26.0–34.0)
MCHC: 32.9 g/dL (ref 30.0–36.0)
MCV: 95.8 fL (ref 80.0–100.0)
Platelets: 208 10*3/uL (ref 150–400)
RBC: 4.48 MIL/uL (ref 3.87–5.11)
RDW: 12.7 % (ref 11.5–15.5)
WBC: 6.4 10*3/uL (ref 4.0–10.5)
nRBC: 0 % (ref 0.0–0.2)

## 2019-05-07 LAB — COMPREHENSIVE METABOLIC PANEL
ALT: 15 U/L (ref 0–44)
AST: 16 U/L (ref 15–41)
Albumin: 4.1 g/dL (ref 3.5–5.0)
Alkaline Phosphatase: 42 U/L (ref 38–126)
Anion gap: 6 (ref 5–15)
BUN: 9 mg/dL (ref 6–20)
CO2: 25 mmol/L (ref 22–32)
Calcium: 9.1 mg/dL (ref 8.9–10.3)
Chloride: 104 mmol/L (ref 98–111)
Creatinine, Ser: 0.67 mg/dL (ref 0.44–1.00)
GFR calc Af Amer: >60 mL/min (ref >60)
GFR calc non Af Amer: >60 mL/min (ref >60)
Glucose, Bld: 95 mg/dL (ref 70–99)
Potassium: 3.9 mmol/L (ref 3.5–5.1)
Sodium: 135 mmol/L (ref 135–145)
Total Bilirubin: 0.7 mg/dL (ref 0.3–1.2)
Total Protein: 7.9 g/dL (ref 6.5–8.1)

## 2019-05-07 LAB — LIPASE, BLOOD: Lipase: 104 U/L — ABNORMAL HIGH (ref 11–51)

## 2019-05-07 MED ORDER — OXYCODONE HCL 5 MG PO TABS
5.0000 mg | ORAL_TABLET | Freq: Once | ORAL | Status: AC
  Administered 2019-05-07: 09:00:00 5 mg via ORAL
  Filled 2019-05-07: qty 1

## 2019-05-07 MED ORDER — ONDANSETRON 4 MG PO TBDP: 4.0000 mg | ORAL_TABLET | Freq: Three times a day (TID) | ORAL | 0 refills | Status: DC | PRN

## 2019-05-07 MED ORDER — OXYCODONE-ACETAMINOPHEN 5-325 MG PO TABS: 1.0000 | ORAL_TABLET | ORAL | 0 refills | Status: DC | PRN

## 2019-05-07 NOTE — ED Provider Notes (Signed)
[facility] Emergency Department Provider Note MRN:  [id number]  Arrival date & time: 05/07/19     Chief Complaint   Abnormal Lab and Abdominal Pain   History of Present Illness   Beth Peterson is a 28 y.o. year-old female with no pertinent past medical history presenting to the ED with chief complaint of abdominal pain.  Location: [place] Duration: 2 days Onset: Gradual Timing: Constant, progressively worsening Description: Sharp Severity: Moderate, 8 out of 10 Exacerbating/Alleviating Factors: None, unchanged with meals Associated Symptoms: Nausea Pertinent Negatives: Denies vomiting, no headache, no chest pain or shortness of breath, no lower abdominal pain, no vaginal bleeding or discharge.  Elevated lipase found at GYN office.  Patient denies heavy alcohol use.   Review of Systems  A complete 10 system review of systems was obtained and all systems are negative except as noted in the HPI and PMH.   Patient's Health History    Past Medical History:  Diagnosis Date  . Abnormal Pap smear of cervix 2016  . 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 Chest pain   . Constipation   . Dysphagia   . Eosinophilic gastroenteritis   . GERD (gastroesophageal reflux disease)   . History of hiatal hernia   . Ovarian cyst   . UTI (urinary tract infection)     Past Surgical History:  Procedure Laterality Date  . APPENDECTOMY  2012   [facility]Dr [id number]  . ESOPHAGOGASTRODUODENOSCOPY  04/02/12   Fields-proximal esophageal web, small hiatal hernia, mild non-H. pylori gastritis, 12.8-16mm savory dilation  . ESOPHAGOGASTRODUODENOSCOPY (EGD) WITH PROPOFOL N/A 08/07/2015   Procedure: ESOPHAGOGASTRODUODENOSCOPY (EGD) WITH PROPOFOL;  Surgeon: Christena DeemMartin U Skulskie, MD;  [facility];  Service: Endoscopy;  Laterality: N/A;  . INTRAUTERINE DEVICE (IUD) INSERTION    . ovarian cyst removed    . uterus tacked  2012    Family History  Problem  Relation Age of Onset  . Hypertension Mother   . Breast cancer Maternal Grandmother        ? age of dx  . Breast cancer Maternal Aunt 55  . Diabetes Maternal Aunt        Type 2  . Hypertension Maternal Aunt     Social History   Socioeconomic History  . Marital status: Single    Spouse name: Not on file  . Number of children: 0  . Years of education: Not on file  . Highest education level: Not on file  Occupational History    Employer: [employer]  Social Needs  . Financial resource strain: Not on file  . Food insecurity    Worry: Not on file    Inability: Not on file  . Transportation needs    Medical: Not on file    Non-medical: Not on file  Tobacco Use  . Smoking status: Never Smoker  . Smokeless tobacco: Never Used  Substance and Sexual Activity  . Alcohol use: Yes    Comment: RARE  . Drug use: No  . Sexual activity: Yes    Birth control/protection: I.U.D.    Comment: Mirena   Lifestyle  . Physical activity    Days per week: Not on file    Minutes per session: Not on file  . Stress: Not on file  Relationships  . Social Musicianconnections    Talks on phone: Not on file    Gets together: Not on file    Attends religious service: Not on file  Active member of club or organization: Not on file    Attends meetings of clubs or organizations: Not on file    Relationship status: Not on file  . Intimate partner violence    Fear of current or ex partner: Not on file    Emotionally abused: Not on file    Physically abused: Not on file    Forced sexual activity: Not on file  Other Topics Concern  . Not on file  Social History Narrative   Lives w/ mother     Physical Exam  Vital Signs and Nursing Notes reviewed Vitals:   05/07/19 0736  BP: (!) 150/95  Pulse: (!) 118  Resp: 16  Temp: 99.5 F (37.5 C)  SpO2: 100%    CONSTITUTIONAL: Well-appearing, NAD NEURO:  Alert and oriented x 3, no focal deficits EYES:  eyes equal and reactive ENT/NECK:  no LAD, no JVD  CARDIO: Regular rate, well-perfused, normal S1 and S2 PULM:  CTAB no wheezing or rhonchi GI/GU:  normal bowel sounds, non-distended, mild epigastric tenderness to palpation MSK/SPINE:  No gross deformities, no edema SKIN:  no rash, atraumatic PSYCH:  Appropriate speech and behavior  Diagnostic and Interventional Summary    Labs Reviewed  LIPASE, BLOOD - Abnormal; Notable for the following components:      Result Value   Lipase 104 (*)    All other components within normal limits  CBC  COMPREHENSIVE METABOLIC PANEL    US Abdomen Limited RUQ  Final Result      Medications  oxyCODONE (Oxy IR/ROXICODONE) immediate release tablet 5 mg (5 mg Oral Given 05/07/19 2947)     Procedures Critical Care  ED Course and Medical Decision Making  I have reviewed the triage vital signs and the nursing notes.  Pertinent labs & imaging results that were available during my care of the patient were reviewed by me and considered in my medical decision making (see below for details).  Question of pancreatitis, etiology unknown, patient does not drink heavily, will ultrasound to evaluate for gallstone.  Patient is a candidate for home management given her mild symptoms and mild lipase elevation.  Work-up is largely reassuring, normal right upper quadrant ultrasound, lipase minimally elevated.  Given patient does have some discomfort and tenderness in the epigastrium, will treat for pancreatitis at home, advised plenty fluids, symptom control, clear liquid diet for the next few days.  Elmer Sow. Pilar Plate, MD [facility] Emergency Medicine [facility] [email or web address]  Final Clinical Impressions(s) / ED Diagnoses     ICD-10-CM   1. Pancreatitis  K85.90 US Abdomen Limited RUQ    US Abdomen Limited RUQ    ED Discharge Orders         Ordered    oxyCODONE-acetaminophen (PERCOCET/ROXICET) 5-325 MG tablet  Every 4 hours PRN     05/07/19 1203    ondansetron (ZOFRAN ODT) 4 MG  disintegrating tablet  Every 8 hours PRN     05/07/19 1203          Discharge Instructions Discussed with and Provided to Patient:   Discharge Instructions     You were evaluated in the Emergency Department and after careful evaluation, we did not find any emergent condition requiring admission or further testing in the hospital.  We think your symptoms are due to a mild case of pancreatitis.  As discussed, please limit your diet to clear liquids for the next 1 or 2 days and then slowly advance your diet.  You can  use the medications provided as needed for discomfort or nausea.  Please return to the Emergency Department if you experience any worsening of your condition.  We encourage you to follow up with a primary care provider.  Thank you for allowing [place] to be a part of your care.       Maudie Flakes, MD 05/07/19 [phone]

## 2019-05-07 NOTE — Discharge Instructions (Addendum)
You were evaluated in the Emergency Department and after careful evaluation, we did not find any emergent condition requiring admission or further testing in the hospital.  We think your symptoms are due to a mild case of pancreatitis.  As discussed, please limit your diet to clear liquids for the next 1 or 2 days and then slowly advance your diet.  You can use the medications provided as needed for discomfort or nausea.  Please return to the Emergency Department if you experience any worsening of your condition.  We encourage you to follow up with a primary care provider.  Thank you for allowing [place] to be a part of your care.

## 2019-05-07 NOTE — ED Triage Notes (Signed)
Pt reports that she was seen at GYN the other day and was called and told her lipase was 176 and to go to ED. Pt c/o abd pains.

## 2019-05-07 NOTE — ED Notes (Signed)
Triage was done by [facility], but was charted under the wrong Epic log-in.

## 2019-05-17 ENCOUNTER — Other Ambulatory Visit: Payer: Self-pay | Admitting: Obstetrics and Gynecology

## 2019-05-17 DIAGNOSIS — B9689 Other specified bacterial agents as the cause of diseases classified elsewhere: Secondary | ICD-10-CM

## 2019-05-18 MED ORDER — METRONIDAZOLE 500 MG PO TABS: ORAL_TABLET | ORAL | 0 refills | Status: DC

## 2019-05-18 NOTE — Telephone Encounter (Signed)
Please advise 

## 2019-05-30 ENCOUNTER — Encounter: Payer: Self-pay | Admitting: Obstetrics and Gynecology

## 2019-05-31 ENCOUNTER — Encounter: Payer: Self-pay | Admitting: Obstetrics and Gynecology

## 2019-05-31 ENCOUNTER — Other Ambulatory Visit: Payer: Self-pay | Admitting: Obstetrics and Gynecology

## 2019-05-31 MED ORDER — MOXIFLOXACIN HCL 400 MG PO TABS: 400.0000 mg | ORAL_TABLET | Freq: Every day | ORAL | 0 refills | Status: AC

## 2019-05-31 NOTE — Progress Notes (Signed)
Rx RF avelox for AV vaginal sx. Not improved with BV. Hx of AV and BV.

## 2019-06-07 ENCOUNTER — Other Ambulatory Visit: Payer: Self-pay | Admitting: Obstetrics and Gynecology

## 2019-06-07 MED ORDER — CLINDAMYCIN HCL 300 MG PO CAPS: 300.0000 mg | ORAL_CAPSULE | Freq: Two times a day (BID) | ORAL | 0 refills | Status: DC

## 2019-06-07 NOTE — Progress Notes (Signed)
Rx clinda for persistent BV sx.

## 2019-08-08 ENCOUNTER — Encounter: Payer: Self-pay | Admitting: Obstetrics and Gynecology

## 2019-08-09 ENCOUNTER — Encounter: Payer: Self-pay | Admitting: Obstetrics and Gynecology

## 2019-08-09 ENCOUNTER — Other Ambulatory Visit: Payer: Self-pay | Admitting: Obstetrics and Gynecology

## 2019-08-09 MED ORDER — CLINDAMYCIN HCL 300 MG PO CAPS: 300.0000 mg | ORAL_CAPSULE | Freq: Two times a day (BID) | ORAL | 0 refills | Status: DC

## 2019-08-09 NOTE — Progress Notes (Signed)
Rx RF clindamycin fo rBV 

## 2019-08-11 ENCOUNTER — Ambulatory Visit: Payer: Managed Care, Other (non HMO) | Admitting: Obstetrics and Gynecology

## 2019-08-16 ENCOUNTER — Encounter: Payer: Self-pay | Admitting: Obstetrics and Gynecology

## 2019-08-16 ENCOUNTER — Ambulatory Visit: Payer: Managed Care, Other (non HMO) | Admitting: Obstetrics and Gynecology

## 2019-08-16 ENCOUNTER — Other Ambulatory Visit: Payer: Self-pay

## 2019-08-16 VITALS — BP 110/80 | Ht 64.0 in | Wt 162.0 lb

## 2019-08-16 DIAGNOSIS — N898 Other specified noninflammatory disorders of vagina: Secondary | ICD-10-CM | POA: Diagnosis not present

## 2019-08-16 DIAGNOSIS — R350 Frequency of micturition: Secondary | ICD-10-CM

## 2019-08-16 DIAGNOSIS — F329 Major depressive disorder, single episode, unspecified: Secondary | ICD-10-CM

## 2019-08-16 DIAGNOSIS — F419 Anxiety disorder, unspecified: Secondary | ICD-10-CM | POA: Diagnosis not present

## 2019-08-16 DIAGNOSIS — Z72820 Sleep deprivation: Secondary | ICD-10-CM

## 2019-08-16 MED ORDER — SERTRALINE HCL 50 MG PO TABS: ORAL_TABLET | ORAL | 1 refills | Status: DC

## 2019-08-16 MED ORDER — MOXIFLOXACIN HCL 400 MG PO TABS: 400.0000 mg | ORAL_TABLET | Freq: Every day | ORAL | 0 refills | Status: AC

## 2019-08-16 NOTE — Patient Instructions (Signed)
I value your feedback and entrusting us with your care. If you get a  patient survey, I would appreciate you taking the time to let us know about your experience today. Thank you! 

## 2019-08-16 NOTE — Progress Notes (Signed)
Patient, No Pcp Per   Chief Complaint  Patient presents with  . Vaginal Discharge    abnormal odor, irritation, no itchiness since last week  . Anxiety  . Urinary Tract Infection    frequency and pelvic pain,no burning when urinating/no blood  . [employer] Employee    HPI:      Beth Peterson is a 28 y.o. G0P0000 who LMP was No LMP recorded. (Menstrual status: IUD)., presents today for increased vag d/c with burning sensation, sour odor. Hx of recurrent BV, treated empirically last wk with clindamycin. Sx persist. 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 Taking probiotics, using boric acid supp, doing metrogel 2x wkly and still gets recurrent sx. She is sex active, usually using condoms. Also with urinary frequency with good flow, no urgency/hematuria. Often gets urinary sx with vaginitis. Has had mild pelvic discomfort.   She is sex active, no new partners. Has IUD.  Has been having anxiety/depression for the past few months. Notes worry, difficulty concentrating, irritation/agitation, nervousness. Has occas SI but wouldn't carry through with it. Talks with her mom. Pt has crazy schedule with work and school, only has time to get a few hrs sleep daily. Sometimes hard to fall asleep, but when she does, often oversleeps due to exhaustion. She is exercising some. No hx of sx in past. No meds to treat in past. No close FH mental illness.   Patient Active Problem List   Diagnosis Date Noted  . Anxiety and depression 08/17/2019  . Sleep deprivation 08/17/2019  . Postcoital and contact bleeding 03/31/2018  . Bacterial vaginosis 03/31/2018  . ASCUS with positive high risk HPV cervical 02/26/2018  . Family history of breast cancer 02/10/2018  . NSAID long-term use 11/12/2012  . Constipation 07/07/2012  . Urinary frequency 07/07/2012  . Chest pain 05/08/2012  . GERD (gastroesophageal reflux disease) 05/08/2012    Past Surgical History:  Procedure Laterality Date  . APPENDECTOMY   2012   [facility]Dr [id number]  . ESOPHAGOGASTRODUODENOSCOPY  04/02/12   Fields-proximal esophageal web, small hiatal hernia, mild non-H. pylori gastritis, 12.8-16mm savory dilation  . ESOPHAGOGASTRODUODENOSCOPY (EGD) WITH PROPOFOL N/A 08/07/2015   Procedure: ESOPHAGOGASTRODUODENOSCOPY (EGD) WITH PROPOFOL;  Surgeon: Christena Deem, MD;  Location: [facility] ENDOSCOPY;  Service: Endoscopy;  Laterality: N/A;  . INTRAUTERINE DEVICE (IUD) INSERTION    . ovarian cyst removed    . uterus tacked  2012    Family History  Problem Relation Age of Onset  . Hypertension Mother   . Breast cancer Maternal Grandmother        ? age of dx  . Breast cancer Maternal Aunt 55  . Diabetes Maternal Aunt        Type 2  . Hypertension Maternal Aunt     Social History   Socioeconomic History  . Marital status: Single    Spouse name: Not on file  . Number of children: 0  . Years of education: Not on file  . Highest education level: Not on file  Occupational History    Employer: [employer]  Social Needs  . Financial resource strain: Not on file  . Food insecurity    Worry: Not on file    Inability: Not on file  . Transportation needs    Medical: Not on file    Non-medical: Not on file  Tobacco Use  . Smoking status: Never Smoker  . Smokeless tobacco: Never Used  Substance and Sexual Activity  . Alcohol use: Yes  Comment: RARE  . Drug use: No  . Sexual activity: Yes    Birth control/protection: I.U.D.    Comment: Mirena   Lifestyle  . Physical activity    Days per week: Not on file    Minutes per session: Not on file  . Stress: Not on file  Relationships  . Social Herbalist on phone: Not on file    Gets together: Not on file    Attends religious service: Not on file    Active member of club or organization: Not on file    Attends meetings of clubs or organizations: Not on file    Relationship status: Not on file  . Intimate partner violence    Fear of current or ex partner: Not on  file    Emotionally abused: Not on file    Physically abused: Not on file    Forced sexual activity: Not on file  Other Topics Concern  . Not on file  Social History Narrative   Lives w/ mother    Outpatient Medications Prior to Visit  Medication Sig Dispense Refill  . acidophilus (RISAQUAD) CAPS capsule Take 1 capsule by mouth daily.    . folic acid (FOLVITE) 1 MG tablet Take 1 mg by mouth daily.    Marland Kitchen levonorgestrel (MIRENA) 20 MCG/24HR IUD 1 each by Intrauterine route once.    . metroNIDAZOLE (METROGEL) 0.75 % vaginal gel 1 applicatorful twice weekly as preventive for 3 months 70 g 1  . ondansetron (ZOFRAN ODT) 4 MG disintegrating tablet Take 1 tablet (4 mg total) by mouth every 8 (eight) hours as needed for nausea or vomiting. 20 tablet 0  . clindamycin (CLEOCIN) 300 MG capsule Take 1 capsule (300 mg total) by mouth 2 (two) times daily for 7 days. 14 capsule 0  . fluconazole (DIFLUCAN) 150 MG tablet Take 1 tab Q4 days for 3 wks (Patient not taking: Reported on 05/07/2019) 6 tablet 0  . ibuprofen (ADVIL) 200 MG tablet Take 400 mg by mouth every 6 (six) hours as needed for mild pain.    . metroNIDAZOLE (FLAGYL) 500 MG tablet Take 1 tab BID for 7 days 14 tablet 0  . oxyCODONE-acetaminophen (PERCOCET/ROXICET) 5-325 MG tablet Take 1 tablet by mouth every 4 (four) hours as needed for severe pain. 5 tablet 0  . terconazole (TERAZOL 7) 0.4 % vaginal cream Place 1 applicator vaginally at bedtime. (Patient not taking: Reported on 05/07/2019) 45 g 0   No facility-administered medications prior to visit.       ROS:  Review of Systems  Constitutional: Negative for fatigue, fever and unexpected weight change.  Respiratory: Negative for cough, shortness of breath and wheezing.   Cardiovascular: Negative for chest pain, palpitations and leg swelling.  Gastrointestinal: Negative for blood in stool, constipation, diarrhea, nausea and vomiting.  Endocrine: Negative for cold intolerance, heat  intolerance and polyuria.  Genitourinary: Positive for frequency, pelvic pain and vaginal discharge. Negative for dyspareunia, dysuria, flank pain, genital sores, hematuria, menstrual problem, urgency, vaginal bleeding and vaginal pain.  Musculoskeletal: Negative for back pain, joint swelling and myalgias.  Skin: Negative for rash.  Neurological: Negative for dizziness, syncope, light-headedness, numbness and headaches.  Hematological: Negative for adenopathy.  Psychiatric/Behavioral: Positive for agitation, dysphoric mood and sleep disturbance. Negative for confusion and suicidal ideas. The patient is nervous/anxious.     OBJECTIVE:   Vitals:  BP 110/80   Ht 5\' 4"  (1.626 m)   Wt 162 lb (73.5 kg)  BMI 27.81 kg/m   Physical Exam Vitals signs reviewed.  Constitutional:      Appearance: She is well-developed.  Neck:     Musculoskeletal: Normal range of motion.  Pulmonary:     Effort: Pulmonary effort is normal.  Genitourinary:    General: Normal vulva.     Pubic Area: No rash.      Labia:        Right: No rash, tenderness or lesion.        Left: No rash, tenderness or lesion.      Vagina: Vaginal discharge present. No erythema or tenderness.     Cervix: Normal.     Uterus: Normal. Not enlarged and not tender.      Adnexa: Right adnexa normal and left adnexa normal.       Right: No mass or tenderness.         Left: No mass or tenderness.    Musculoskeletal: Normal range of motion.  Skin:    General: Skin is warm and dry.  Neurological:     General: No focal deficit present.     Mental Status: She is alert and oriented to person, place, and time.  Psychiatric:        Mood and Affect: Mood normal.        Behavior: Behavior normal.        Thought Content: Thought content normal.        Judgment: Judgment normal.     Results: Results for orders placed or performed in visit on 08/16/19 (from the past 24 hour(s))  POCT Wet Prep with KOH     Status: Normal   Collection  Time: 08/17/19 11:17 AM  Result Value Ref Range   Trichomonas, UA Negative    Clue Cells Wet Prep HPF POC neg    Epithelial Wet Prep HPF POC     Yeast Wet Prep HPF POC neg    Bacteria Wet Prep HPF POC     RBC Wet Prep HPF POC     WBC Wet Prep HPF POC     KOH Prep POC Negative Negative  POCT Urinalysis Dipstick     Status: Normal   Collection Time: 08/17/19 11:20 AM  Result Value Ref Range   Color, UA pale    Clarity, UA clear    Glucose, UA Negative Negative   Bilirubin, UA neg    Ketones, UA neg    Spec Grav, UA 1.020 1.010 - 1.025   Blood, UA neg    pH, UA 6.0 5.0 - 8.0   Protein, UA Negative Negative   Urobilinogen, UA     Nitrite, UA neg    Leukocytes, UA Negative Negative   Appearance     Odor     GAD 7 : Generalized Anxiety Score 08/16/2019  Nervous, Anxious, on Edge 3  Control/stop worrying 3  Worry too much - different things 3  Trouble relaxing 3  Restless 2  Easily annoyed or irritable 3  Afraid - awful might happen 2  Total GAD 7 Score 19  Anxiety Difficulty Very difficult    Depression screen PHQ 2/9 08/16/2019  Decreased Interest 0  Down, Depressed, Hopeless 3  PHQ - 2 Score 3  Altered sleeping 2  Tired, decreased energy 3  Change in appetite 3  Feeling bad or failure about yourself  3  Trouble concentrating 2  Moving slowly or fidgety/restless 3  Suicidal thoughts 1  PHQ-9 Score 20  Difficult doing work/chores Very difficult  Assessment/Plan: Vaginal discharge - Plan: moxifloxacin (AVELOX) 400 MG tablet, POCT Wet Prep with KOH; neg wet prep, pos sx. Already treated with clindamycin. Treat for AV. Rx avelox. Cont boric acid, metrogel twice wkly, probiotics. Use condoms EVERY time. F/u prn.   Urinary frequency--neg UA. Most likely vaginitis sx. F/u prn.   Anxiety and depression - Plan: sertraline (ZOLOFT) 50 MG tablet; Discussed SSRIs. Pt would like to start tx. Rx zoloft. RTO in 7 wks for f/u. Sx most likely exacerbated by sleep deprivation.  Schedule mgmt/try to get more sleep.   Sleep deprivation    Meds ordered this encounter  Medications  . moxifloxacin (AVELOX) 400 MG tablet    Sig: Take 1 tablet (400 mg total) by mouth daily for 6 days.    Dispense:  6 tablet    Refill:  0    Order Specific Question:   Supervising Provider    Answer:   Nadara MustardHARRIS, ROBERT P [id number]]  . sertraline (ZOLOFT) 50 MG tablet    Sig: Take 1/2 tab daily for 6 days, then 1 tab daily    Dispense:  30 tablet    Refill:  1    Order Specific Question:   Supervising Provider    Answer:   Nadara MustardHARRIS, ROBERT P [[id number]]      Return in about 7 weeks (around 10/04/2019) for anxiety/derpession f/u.  Korban Shearer B. Elverda Wendel, PA-C 08/17/2019 11:21 AM

## 2019-08-17 ENCOUNTER — Encounter: Payer: Self-pay | Admitting: Obstetrics and Gynecology

## 2019-08-17 DIAGNOSIS — F329 Major depressive disorder, single episode, unspecified: Secondary | ICD-10-CM | POA: Insufficient documentation

## 2019-08-17 DIAGNOSIS — Z72820 Sleep deprivation: Secondary | ICD-10-CM | POA: Insufficient documentation

## 2019-08-17 DIAGNOSIS — F419 Anxiety disorder, unspecified: Secondary | ICD-10-CM | POA: Insufficient documentation

## 2019-08-17 LAB — POCT URINALYSIS DIPSTICK
Bilirubin, UA: NEGATIVE
Blood, UA: NEGATIVE
Glucose, UA: NEGATIVE
Ketones, UA: NEGATIVE
Leukocytes, UA: NEGATIVE
Nitrite, UA: NEGATIVE
Protein, UA: NEGATIVE
Spec Grav, UA: 1.02 (ref 1.010–1.025)
pH, UA: 6 (ref 5.0–8.0)

## 2019-08-17 LAB — POCT WET PREP WITH KOH
Clue Cells Wet Prep HPF POC: NEGATIVE
KOH Prep POC: NEGATIVE
Trichomonas, UA: NEGATIVE
Yeast Wet Prep HPF POC: NEGATIVE

## 2019-08-18 ENCOUNTER — Encounter: Payer: Self-pay | Admitting: Obstetrics and Gynecology

## 2019-08-24 ENCOUNTER — Telehealth: Payer: Self-pay | Admitting: Obstetrics and Gynecology

## 2019-08-24 NOTE — Telephone Encounter (Signed)
Attempt to reach patient to get her schedule. Voicemail box is full unable to leave message.

## 2019-08-24 NOTE — Telephone Encounter (Signed)
Pls put on nurse sched for UA/nuswab/ABC to give her samples. Thx.

## 2019-08-24 NOTE — Telephone Encounter (Signed)
Voicemail full unable to leave message. Message sent thru Mychart to reach out to patient

## 2019-08-25 ENCOUNTER — Ambulatory Visit (INDEPENDENT_AMBULATORY_CARE_PROVIDER_SITE_OTHER): Payer: Managed Care, Other (non HMO)

## 2019-08-25 ENCOUNTER — Other Ambulatory Visit: Payer: Self-pay

## 2019-08-25 ENCOUNTER — Ambulatory Visit: Payer: Managed Care, Other (non HMO)

## 2019-08-25 DIAGNOSIS — R399 Unspecified symptoms and signs involving the genitourinary system: Secondary | ICD-10-CM | POA: Diagnosis not present

## 2019-08-25 DIAGNOSIS — N760 Acute vaginitis: Secondary | ICD-10-CM | POA: Diagnosis not present

## 2019-08-25 DIAGNOSIS — N898 Other specified noninflammatory disorders of vagina: Secondary | ICD-10-CM | POA: Diagnosis not present

## 2019-08-25 DIAGNOSIS — B9689 Other specified bacterial agents as the cause of diseases classified elsewhere: Secondary | ICD-10-CM | POA: Diagnosis not present

## 2019-08-25 LAB — POCT URINALYSIS DIPSTICK
Bilirubin, UA: NEGATIVE
Glucose, UA: NEGATIVE
Ketones, UA: NEGATIVE
Leukocytes, UA: NEGATIVE
Nitrite, UA: NEGATIVE
Protein, UA: POSITIVE — AB
Spec Grav, UA: 1.015 (ref 1.010–1.025)
pH, UA: 6.5 (ref 5.0–8.0)

## 2019-08-25 NOTE — Progress Notes (Signed)
Pt here for f/u testing. N/C to pt. Hx of recurrent BV and AV. Treated early Dec with clindamycin for BV sx. Also doing boric acid QHS, probiotics, and twice wkly metrogel. Sx persisting. Need to check nuswab [employer] employee).  Pt also with vaginal burning. Treated 08/16/19 with avelox for AV sx but sx persisting. Neg  UA 08/16/19. Will rechk UA today, C&S. Burning sx improved today. Drinking lots of water/cranberry juice.  Pt to stop boric acid supp, cont probiotics. Has IUD and may need to remove and try OCPs given recurrent sx. Try 1/2 g premarin vag crm for next 8 days while awaiting culture results to see if vaginal estrogen improves vaginal flora/microbiome. Will f/u with resutls. 1 sample given.   Results for orders placed or performed in visit on 08/25/19 (from the past 24 hour(s))  POCT Urinalysis Dipstick     Status: Abnormal   Collection Time: 08/25/19 11:28 AM  Result Value Ref Range   Color, UA     Clarity, UA     Glucose, UA Negative Negative   Bilirubin, UA neg    Ketones, UA neg    Spec Grav, UA 1.015 1.010 - 1.025   Blood, UA 5-10    pH, UA 6.5 5.0 - 8.0   Protein, UA Positive (A) Negative   Urobilinogen, UA     Nitrite, UA neg    Leukocytes, UA Negative Negative   Appearance     Odor

## 2019-08-25 NOTE — Patient Instructions (Signed)
I value your feedback and entrusting us with your care. If you get a  patient survey, I would appreciate you taking the time to let us know about your experience today. Thank you!  As of August 19, 2019, your lab results will be released to your MyChart immediately, before I even have a chance to see them. Please give me time to review them and contact you if there are any abnormalities. Thank you for your patience.  

## 2019-08-27 LAB — URINE CULTURE: Organism ID, Bacteria: NO GROWTH

## 2019-08-28 ENCOUNTER — Other Ambulatory Visit: Payer: Self-pay | Admitting: Obstetrics and Gynecology

## 2019-08-28 LAB — NUSWAB VAGINITIS PLUS (VG+)
Candida albicans, NAA: NEGATIVE
Candida glabrata, NAA: NEGATIVE
Chlamydia trachomatis, NAA: NEGATIVE
Megasphaera 1: HIGH Score — AB
Neisseria gonorrhoeae, NAA: NEGATIVE
Trich vag by NAA: NEGATIVE

## 2019-08-28 MED ORDER — CLINDAMYCIN HCL 300 MG PO CAPS: 300.0000 mg | ORAL_CAPSULE | Freq: Two times a day (BID) | ORAL | 0 refills | Status: AC

## 2019-08-28 NOTE — Progress Notes (Signed)
Rx RF clindamycin for recurrent BV, seen on culture

## 2019-09-13 ENCOUNTER — Encounter: Payer: Self-pay | Admitting: Obstetrics and Gynecology

## 2019-09-13 ENCOUNTER — Other Ambulatory Visit: Payer: Self-pay | Admitting: Obstetrics and Gynecology

## 2019-09-13 DIAGNOSIS — N898 Other specified noninflammatory disorders of vagina: Secondary | ICD-10-CM

## 2019-09-13 MED ORDER — MOXIFLOXACIN HCL 400 MG PO TABS: 400.0000 mg | ORAL_TABLET | Freq: Every day | ORAL | 0 refills | Status: AC

## 2019-09-13 NOTE — Progress Notes (Signed)
Rx RF avelox for AV sx,

## 2019-10-05 ENCOUNTER — Ambulatory Visit: Payer: Managed Care, Other (non HMO) | Admitting: Obstetrics and Gynecology

## 2019-10-05 NOTE — Progress Notes (Deleted)
Patient, No Pcp Per   No chief complaint on file.   HPI:      [name] is a 29 y.o. G0P0000 who LMP was No LMP recorded. (Menstrual status: IUD)., presents today for anxiety/depression f/u. Started zoloft 12/20.   Has been having anxiety/depression for the past few months. Notes worry, difficulty concentrating, irritation/agitation, nervousness. Has occas SI but wouldn't carry through with it. Talks with her mom. Pt has crazy schedule with work and school, only has time to get a few hrs sleep daily. Sometimes hard to fall asleep, but when she does, often oversleeps due to exhaustion. She is exercising some. No hx of sx in past. No meds to treat in past. No close FH mental illness.   Patient Active Problem List   Diagnosis Date Noted  . Anxiety and depression 08/17/2019  . Sleep deprivation 08/17/2019  . Postcoital and contact bleeding 03/31/2018  . Bacterial vaginosis 03/31/2018  . ASCUS with positive high risk HPV cervical 02/26/2018  . Family history of breast cancer 02/10/2018  . NSAID long-term use 11/12/2012  . Constipation 07/07/2012  . Urinary frequency 07/07/2012  . Chest pain 05/08/2012  . GERD (gastroesophageal reflux disease) 05/08/2012    Past Surgical History:  Procedure Laterality Date  . APPENDECTOMY  2012   [facility] [id number]  . ESOPHAGOGASTRODUODENOSCOPY  04/02/12   Fields-proximal esophageal web, small hiatal hernia, mild non-H. pylori gastritis, 12.8-16mm savory dilation  . ESOPHAGOGASTRODUODENOSCOPY (EGD) WITH PROPOFOL N/A 08/07/2015   Procedure: ESOPHAGOGASTRODUODENOSCOPY (EGD) WITH PROPOFOL;  Surgeon: Christena Deem, MD;  Location: [facility] ENDOSCOPY;  Service: Endoscopy;  Laterality: N/A;  . INTRAUTERINE DEVICE (IUD) INSERTION    . ovarian cyst removed    . uterus tacked  2012    Family History  Problem Relation Age of Onset  . Hypertension Mother   . Breast cancer Maternal Grandmother        ? age of dx  . Breast cancer Maternal Aunt 55   . Diabetes Maternal Aunt        Type 2  . Hypertension Maternal Aunt     Social History   Socioeconomic History  . Marital status: Single    Spouse name: Not on file  . Number of children: 0  . Years of education: Not on file  . Highest education level: Not on file  Occupational History    Employer: [employer]  Tobacco Use  . Smoking status: Never Smoker  . Smokeless tobacco: Never Used  Substance and Sexual Activity  . Alcohol use: Yes    Comment: RARE  . Drug use: No  . Sexual activity: Yes    Birth control/protection: I.U.D.    Comment: Mirena   Other Topics Concern  . Not on file  Social History Narrative   Lives w/ mother   Social Determinants of Health   Financial Resource Strain:   . Difficulty of Paying Living Expenses: Not on file  Food Insecurity:   . Worried About Programme researcher, broadcasting/film/video in the Last Year: Not on file  . Ran Out of Food in the Last Year: Not on file  Transportation Needs:   . Lack of Transportation (Medical): Not on file  . Lack of Transportation (Non-Medical): Not on file  Physical Activity:   . Days of Exercise per Week: Not on file  . Minutes of Exercise per Session: Not on file  Stress:   . Feeling of Stress : Not on file  Social Connections:   .  Frequency of Communication with Friends and Family: Not on file  . Frequency of Social Gatherings with Friends and Family: Not on file  . Attends Religious Services: Not on file  . Active Member of Clubs or Organizations: Not on file  . Attends Archivist Meetings: Not on file  . Marital Status: Not on file  Intimate Partner Violence:   . Fear of Current or Ex-Partner: Not on file  . Emotionally Abused: Not on file  . Physically Abused: Not on file  . Sexually Abused: Not on file    Outpatient Medications Prior to Visit  Medication Sig Dispense Refill  . acidophilus (RISAQUAD) CAPS capsule Take 1 capsule by mouth daily.    . folic acid (FOLVITE) 1 MG tablet Take 1 mg by  mouth daily.    Marland Kitchen levonorgestrel (MIRENA) 20 MCG/24HR IUD 1 each by Intrauterine route once.    . metroNIDAZOLE (METROGEL) 0.75 % vaginal gel 1 applicatorful twice weekly as preventive for 3 months 70 g 1  . ondansetron (ZOFRAN ODT) 4 MG disintegrating tablet Take 1 tablet (4 mg total) by mouth every 8 (eight) hours as needed for nausea or vomiting. 20 tablet 0  . sertraline (ZOLOFT) 50 MG tablet Take 1/2 tab daily for 6 days, then 1 tab daily 30 tablet 1   No facility-administered medications prior to visit.      ROS:  Review of Systems BREAST: No symptoms   OBJECTIVE:   Vitals:  There were no vitals taken for this visit.  Physical Exam  Results: No results found for this or any previous visit (from the past 24 hour(s)).   Assessment/Plan: No diagnosis found.    No orders of the defined types were placed in this encounter.     No follow-ups on file.  Johnhenry Tippin B. Caleb Decock, PA-C 10/05/2019 11:10 AM

## 2019-10-11 ENCOUNTER — Other Ambulatory Visit: Payer: Self-pay | Admitting: Obstetrics and Gynecology

## 2019-10-11 ENCOUNTER — Encounter: Payer: Self-pay | Admitting: Obstetrics and Gynecology

## 2019-10-11 MED ORDER — FLUCONAZOLE 150 MG PO TABS: 150.0000 mg | ORAL_TABLET | Freq: Once | ORAL | 0 refills | Status: AC

## 2019-10-11 NOTE — Progress Notes (Signed)
Rx diflucan for vag sx with amox tx.

## 2019-10-12 ENCOUNTER — Encounter: Payer: Self-pay | Admitting: Obstetrics and Gynecology

## 2019-10-13 ENCOUNTER — Encounter: Payer: Self-pay | Admitting: Obstetrics and Gynecology

## 2019-10-16 ENCOUNTER — Ambulatory Visit
Admission: EM | Admit: 2019-10-16 | Discharge: 2019-10-16 | Disposition: A | Discharge status: Home / Self Care | Payer: Managed Care, Other (non HMO)

## 2019-10-16 ENCOUNTER — Encounter: Payer: Self-pay | Admitting: Emergency Medicine

## 2019-10-16 ENCOUNTER — Other Ambulatory Visit: Payer: Self-pay

## 2019-10-16 DIAGNOSIS — J039 Acute tonsillitis, unspecified: Secondary | ICD-10-CM

## 2019-10-16 DIAGNOSIS — J029 Acute pharyngitis, unspecified: Secondary | ICD-10-CM | POA: Diagnosis not present

## 2019-10-16 MED ORDER — FLUTICASONE PROPIONATE 50 MCG/ACT NA SUSP: 2.0000 | Freq: Every day | NASAL | 0 refills | Status: DC

## 2019-10-16 MED ORDER — AZELASTINE HCL 0.1 % NA SOLN: 2.0000 | Freq: Two times a day (BID) | NASAL | 0 refills | Status: DC

## 2019-10-16 MED ORDER — LIDOCAINE VISCOUS HCL 2 % MT SOLN: OROMUCOSAL | 0 refills | Status: DC

## 2019-10-16 NOTE — ED Triage Notes (Signed)
Pt sts was seen 5 days ago for similar with sore throat and had neg rapid strep and covid; was given antibiotics; pt sts still has white patches to throat and now having left sided ear pain; pt sts taking last doses of antibiotics today

## 2019-10-16 NOTE — Discharge Instructions (Addendum)
As discussed, symptoms can still be due to viral illness/ drainage down your throat. This usually takes 7-10 days to resolve. Start lidocaine for sore throat, do not eat or drink for the next 40 mins after use as it can stunt your gag reflex. Start flonase and azelastine for left ear pain and possible post nasal drainage. Monitor for any worsening of symptoms, swelling of the throat, trouble breathing, trouble swallowing, leaning forward to breath, drooling, go to the emergency department for further evaluation needed. Follow up with ENT if symptoms still not improving in 4-5 days.   For sore throat/cough try using a honey-based tea. Use 3 teaspoons of honey with juice squeezed from half lemon. Place shaved pieces of ginger into 1/2-1 cup of water and warm over stove top. Then mix the ingredients and repeat every 4 hours as needed.

## 2019-10-16 NOTE — ED Provider Notes (Signed)
[facility]    CSN: [id number] Arrival date & time: 10/16/19  0850      History   Chief Complaint Chief Complaint  Patient presents with  . Sore Throat  . Otalgia    HPI Beth Peterson is a 29 y.o. female.   29 year old female comes in for 1 week history of headache, nausea, sore throat. Went to a different urgent care and was found to be febrile at 102. At the time, rapid strep and COVID were done, which were both negative. She was sent home with amoxicillin to cover for tonsillitis, 500 twice a day for 7 days, Zofran, Diflucan.  Sent out Covid PCR, strep culture were also both negative.  Fever resolved 4 days ago.  States has residual sore throat with continued white spots to the left tonsil.  Now having some left-sided ear pain, that is worse with swallowing.  Denies muffled hearing, ear drainage.  Has had some cough.  Denies nasal congestion, rhinorrhea.  Developed diarrhea thought to be related to antibiotics.  Denies abdominal pain, nausea, vomiting.  Denies shortness of breath, loss of taste or smell.     Past Medical History:  Diagnosis Date  . Abnormal Pap smear of cervix 2016  . 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 Chest pain   . Constipation   . Dysphagia   . Eosinophilic gastroenteritis   . GERD (gastroesophageal reflux disease)   . History of hiatal hernia   . Ovarian cyst   . UTI (urinary tract infection)     Patient Active Problem List   Diagnosis Date Noted  . Anxiety and depression 08/17/2019  . Sleep deprivation 08/17/2019  . Postcoital and contact bleeding 03/31/2018  . Bacterial vaginosis 03/31/2018  . ASCUS with positive high risk HPV cervical 02/26/2018  . Family history of breast cancer 02/10/2018  . NSAID long-term use 11/12/2012  . Constipation 07/07/2012  . Urinary frequency 07/07/2012  . Chest pain 05/08/2012  . GERD (gastroesophageal reflux disease) 05/08/2012    Past Surgical History:  Procedure  Laterality Date  . APPENDECTOMY  2012   [facility] [id number]  . ESOPHAGOGASTRODUODENOSCOPY  04/02/12   Fields-proximal esophageal web, small hiatal hernia, mild non-H. pylori gastritis, 12.8-16mm savory dilation  . ESOPHAGOGASTRODUODENOSCOPY (EGD) WITH PROPOFOL N/A 08/07/2015   Procedure: ESOPHAGOGASTRODUODENOSCOPY (EGD) WITH PROPOFOL;  Surgeon: Lollie Sails, MD;  Location: [facility] ENDOSCOPY;  Service: Endoscopy;  Laterality: N/A;  . INTRAUTERINE DEVICE (IUD) INSERTION    . ovarian cyst removed    . uterus tacked  2012    OB History    Gravida  0   Para  0   Term  0   Preterm  0   AB  0   Living  0     SAB  0   TAB  0   Ectopic  0   Multiple  0   Live Births  0            Home Medications    Prior to Admission medications   Medication Sig Start Date End Date Taking? Authorizing Provider  amoxicillin (AMOXIL) 500 MG capsule Take 500 mg by mouth 2 (two) times daily.   Yes [provider]  acidophilus (RISAQUAD) CAPS capsule Take 1 capsule by mouth daily.    [provider]  azelastine (ASTELIN) 0.1 % nasal spray Place 2 sprays into both nostrils 2 (two) times daily. 10/16/19   Tasia Catchings, English Tomer V, PA-C  fluticasone (  FLONASE) 50 MCG/ACT nasal spray Place 2 sprays into both nostrils daily. 10/16/19   Cathie Hoops, Runette Scifres V, PA-C  folic acid (FOLVITE) 1 MG tablet Take 1 mg by mouth daily. 08/06/19   [provider]  levonorgestrel (MIRENA) 20 MCG/24HR IUD 1 each by Intrauterine route once.    [provider]  lidocaine (XYLOCAINE) 2 % solution 5-15 mL gurgle as needed 10/16/19   Cathie Hoops, Amiera Herzberg V, PA-C  metroNIDAZOLE (METROGEL) 0.75 % vaginal gel 1 applicatorful twice weekly as preventive for 3 months Patient not taking: Reported on 10/16/2019 03/02/19   Copland, Helmut Muster B, PA-C  ondansetron (ZOFRAN ODT) 4 MG disintegrating tablet Take 1 tablet (4 mg total) by mouth every 8 (eight) hours as needed for nausea or vomiting. 05/07/19   Sabas Sous, MD  sertraline (ZOLOFT) 50 MG  tablet Take 1/2 tab daily for 6 days, then 1 tab daily 08/16/19   Copland, Ilona Sorrel, PA-C    Family History Family History  Problem Relation Age of Onset  . Hypertension Mother   . Breast cancer Maternal Grandmother        ? age of dx  . Breast cancer Maternal Aunt 55  . Diabetes Maternal Aunt        Type 2  . Hypertension Maternal Aunt     Social History Social History   Tobacco Use  . Smoking status: Never Smoker  . Smokeless tobacco: Never Used  Substance Use Topics  . Alcohol use: Yes    Comment: RARE  . Drug use: No     Allergies   Dexlansoprazole   Review of Systems Review of Systems  Reason unable to perform ROS: See HPI as above.     Physical Exam Triage Vital Signs ED Triage Vitals [10/16/19 0904]  Enc Vitals Group     BP 131/87     Pulse Rate 99     Resp 18     Temp 98.3 F (36.8 C)     Temp Source Oral     SpO2 98 %     Weight      Height      Head Circumference      Peak Flow      Pain Score 5     Pain Loc      Pain Edu?      Excl. in [place]?    No data found.  Updated Vital Signs BP 131/87 (BP Location: Left Arm)   Pulse 99   Temp 98.3 F (36.8 C) (Oral)   Resp 18   SpO2 98%   Physical Exam Constitutional:      General: She is not in acute distress.    Appearance: Normal appearance. She is not ill-appearing, toxic-appearing or diaphoretic.  HENT:     Head: Normocephalic and atraumatic.     Right Ear: Tympanic membrane, ear canal and external ear normal. Tympanic membrane is not erythematous or bulging.     Left Ear: Tympanic membrane, ear canal and external ear normal. Tympanic membrane is not erythematous or bulging.     Nose:     Right Sinus: No maxillary sinus tenderness or frontal sinus tenderness.     Left Sinus: No maxillary sinus tenderness or frontal sinus tenderness.     Mouth/Throat:     Mouth: Mucous membranes are moist.     Pharynx: Oropharynx is clear. Uvula midline.     Tonsils: 1+ on the right. 1+ on the left.      Comments: Left  tonsil with exudates Cardiovascular:     Rate and Rhythm: Normal rate and regular rhythm.     Heart sounds: Normal heart sounds. No murmur. No friction rub. No gallop.   Pulmonary:     Effort: Pulmonary effort is normal. No accessory muscle usage, prolonged expiration, respiratory distress or retractions.     Comments: Lungs clear to auscultation without adventitious lung sounds. Musculoskeletal:     Cervical back: Normal range of motion and neck supple.  Neurological:     General: No focal deficit present.     Mental Status: She is alert and oriented to person, place, and time.      UC Treatments / Results  Labs (all labs ordered are listed, but only abnormal results are displayed) Labs Reviewed - No data to display  EKG   Radiology No results found.  Procedures Procedures (including critical care time)  Medications Ordered in UC Medications - No data to display  Initial Impression / Assessment and Plan / UC Course  I have reviewed the triage vital signs and the nursing notes.  Pertinent labs & imaging results that were available during my care of the patient were reviewed by me and considered in my medical decision making (see chart for details).    Patient on last day of amoxicillin.  Oropharynx clear, moist, uvula midline.  Bilateral tonsil 1+, with left tonsil having exudates.  Speaking in full sentences without difficulty.  No muffled voice.  Low suspicion for peritonsillar abscess at this time.  Discussed if symptoms not improving with antibiotics, most likely viral in etiology.  Discussed postnasal drip as possible cause as well.  Will provide symptomatic treatment with viscous lidocaine, nasal sprays at this time.  Push fluids.  Return precautions given.  If symptoms do not improving, to follow-up with ENT for further evaluation.  Patient expresses understanding and agrees to plan.  Final Clinical Impressions(s) / UC Diagnoses   Final diagnoses:   Sore throat  Acute tonsillitis, unspecified etiology   ED Prescriptions    Medication Sig Dispense Auth. Provider   lidocaine (XYLOCAINE) 2 % solution 5-15 mL gurgle as needed 150 mL Adalie Mand V, PA-C   azelastine (ASTELIN) 0.1 % nasal spray Place 2 sprays into both nostrils 2 (two) times daily. 30 mL Dniyah Grant V, PA-C   fluticasone (FLONASE) 50 MCG/ACT nasal spray Place 2 sprays into both nostrils daily. 1 g Belinda Fisher, PA-C     PDMP not reviewed this encounter.   Belinda Fisher, PA-C 10/16/19 [phone]

## 2019-10-31 ENCOUNTER — Encounter: Payer: Self-pay | Admitting: Obstetrics and Gynecology

## 2019-10-31 ENCOUNTER — Encounter: Payer: Self-pay | Admitting: Emergency Medicine

## 2019-10-31 ENCOUNTER — Ambulatory Visit
Admission: EM | Admit: 2019-10-31 | Discharge: 2019-10-31 | Disposition: A | Discharge status: Home / Self Care | Payer: Managed Care, Other (non HMO) | Attending: Physician Assistant | Admitting: Physician Assistant

## 2019-10-31 ENCOUNTER — Other Ambulatory Visit: Payer: Self-pay

## 2019-10-31 DIAGNOSIS — N898 Other specified noninflammatory disorders of vagina: Secondary | ICD-10-CM | POA: Insufficient documentation

## 2019-10-31 MED ORDER — VALACYCLOVIR HCL 1 G PO TABS: 1000.0000 mg | ORAL_TABLET | Freq: Two times a day (BID) | ORAL | 0 refills | Status: AC

## 2019-10-31 MED ORDER — METRONIDAZOLE 500 MG PO TABS: 500.0000 mg | ORAL_TABLET | Freq: Two times a day (BID) | ORAL | 0 refills | Status: DC

## 2019-10-31 NOTE — ED Triage Notes (Signed)
Pt here for vaginal discharge and irritation x 4 days

## 2019-10-31 NOTE — Discharge Instructions (Signed)
Start valtrex as directed. Flagyl for possible BV. Testing sent, you will be contacted with any positive results that requires further treatment. Refrain from sexual activity and alcohol use for the next 7 days. Monitor for any worsening of symptoms, fever, abdominal pain, nausea, vomiting, to follow up for reevaluation.

## 2019-10-31 NOTE — ED Provider Notes (Signed)
[facility]    CSN: [id number] Arrival date & time: 10/31/19  1443      History   Chief Complaint Chief Complaint  Patient presents with  . Vaginal Discharge    HPI Beth Peterson is a 29 y.o. female.   29 year old female comes in for 4 day history of vaginal discharge, irritation. Has vaginal itching. Noticed "bumps" to the vulva area. Denies urinary frequency, dysuria, hematuria. Denies abdominal pain, nausea, vomiting. Denies fever, chills, flank/back pain. IUD without cycles. In monogamous relationship. No history of HSV.      Past Medical History:  Diagnosis Date  . Abnormal Pap smear of cervix 2016  . 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 Chest pain   . Constipation   . Dysphagia   . Eosinophilic gastroenteritis   . GERD (gastroesophageal reflux disease)   . History of hiatal hernia   . Ovarian cyst   . UTI (urinary tract infection)     Patient Active Problem List   Diagnosis Date Noted  . Anxiety and depression 08/17/2019  . Sleep deprivation 08/17/2019  . Postcoital and contact bleeding 03/31/2018  . Bacterial vaginosis 03/31/2018  . ASCUS with positive high risk HPV cervical 02/26/2018  . Family history of breast cancer 02/10/2018  . NSAID long-term use 11/12/2012  . Constipation 07/07/2012  . Urinary frequency 07/07/2012  . Chest pain 05/08/2012  . GERD (gastroesophageal reflux disease) 05/08/2012    Past Surgical History:  Procedure Laterality Date  . APPENDECTOMY  2012   [facility] [id number]  . ESOPHAGOGASTRODUODENOSCOPY  04/02/12   Fields-proximal esophageal web, small hiatal hernia, mild non-H. pylori gastritis, 12.8-16mm savory dilation  . ESOPHAGOGASTRODUODENOSCOPY (EGD) WITH PROPOFOL N/A 08/07/2015   Procedure: ESOPHAGOGASTRODUODENOSCOPY (EGD) WITH PROPOFOL;  Surgeon: Christena Deem, MD;  Location: [facility] ENDOSCOPY;  Service: Endoscopy;  Laterality: N/A;  . INTRAUTERINE DEVICE (IUD) INSERTION    . ovarian cyst  removed    . uterus tacked  2012    OB History    Gravida  0   Para  0   Term  0   Preterm  0   AB  0   Living  0     SAB  0   TAB  0   Ectopic  0   Multiple  0   Live Births  0            Home Medications    Prior to Admission medications   Medication Sig Start Date End Date Taking? Authorizing Provider  folic acid (FOLVITE) 1 MG tablet Take 1 mg by mouth daily. 08/06/19   [provider]  levonorgestrel (MIRENA) 20 MCG/24HR IUD 1 each by Intrauterine route once.    [provider]  metroNIDAZOLE (FLAGYL) 500 MG tablet Take 1 tablet (500 mg total) by mouth 2 (two) times daily. 10/31/19   Cathie Hoops, Soledad Budreau V, PA-C  valACYclovir (VALTREX) 1000 MG tablet Take 1 tablet (1,000 mg total) by mouth 2 (two) times daily for 7 days. 10/31/19 11/07/19  Belinda Fisher, PA-C  azelastine (ASTELIN) 0.1 % nasal spray Place 2 sprays into both nostrils 2 (two) times daily. Patient not taking: Reported on 10/31/2019 10/16/19 10/31/19  Belinda Fisher, PA-C  fluticasone [facility]) 50 MCG/ACT nasal spray Place 2 sprays into both nostrils daily. Patient not taking: Reported on 10/31/2019 10/16/19 10/31/19  Belinda Fisher, PA-C  sertraline (ZOLOFT) 50 MG tablet Take 1/2 tab daily for 6 days, then 1 tab  daily Patient not taking: Reported on 10/31/2019 08/16/19 12/27/35  Copland, Deirdre Evener, PA-C    Family History Family History  Problem Relation Age of Onset  . Hypertension Mother   . Breast cancer Maternal Grandmother        ? age of dx  . Breast cancer Maternal Aunt 51  . Diabetes Maternal Aunt        Type 2  . Hypertension Maternal Aunt     Social History Social History   Tobacco Use  . Smoking status: Never Smoker  . Smokeless tobacco: Never Used  Substance Use Topics  . Alcohol use: Yes    Comment: RARE  . Drug use: No     Allergies   Dexlansoprazole   Review of Systems Review of Systems  Reason unable to perform ROS: See HPI as above.     Physical Exam Triage Vital  Signs ED Triage Vitals  Enc Vitals Group     BP 10/31/19 1453 131/84     Pulse Rate 10/31/19 1453 84     Resp 10/31/19 1453 18     Temp 10/31/19 1453 98.4 F (36.9 C)     Temp Source 10/31/19 1453 Oral     SpO2 10/31/19 1453 98 %     Weight --      Height --      Head Circumference --      Peak Flow --      Pain Score 10/31/19 1501 0     Pain Loc --      Pain Edu? --      Excl. in [place]? --    No data found.  Updated Vital Signs BP 131/84 (BP Location: Left Arm)   Pulse 84   Temp 98.4 F (36.9 C) (Oral)   Resp 18   SpO2 98%   Visual Acuity Right Eye Distance:   Left Eye Distance:   Bilateral Distance:    Right Eye Near:   Left Eye Near:    Bilateral Near:     Physical Exam Exam conducted with a chaperone present.  Constitutional:      General: She is not in acute distress.    Appearance: Normal appearance. She is well-developed. She is not toxic-appearing or diaphoretic.  HENT:     Head: Normocephalic and atraumatic.  Eyes:     Conjunctiva/sclera: Conjunctivae normal.     Pupils: Pupils are equal, round, and reactive to light.  Pulmonary:     Effort: Pulmonary effort is normal. No respiratory distress.     Comments: Speaking in full sentences without difficulty Genitourinary:   Musculoskeletal:     Cervical back: Normal range of motion and neck supple.  Skin:    General: Skin is warm and dry.  Neurological:     Mental Status: She is alert and oriented to person, place, and time.      UC Treatments / Results  Labs (all labs ordered are listed, but only abnormal results are displayed) Labs Reviewed  HSV CULTURE AND TYPING  CERVICOVAGINAL ANCILLARY ONLY    EKG   Radiology No results found.  Procedures Procedures (including critical care time)  Medications Ordered in UC Medications - No data to display  Initial Impression / Assessment and Plan / UC Course  I have reviewed the triage vital signs and the nursing notes.  Pertinent labs &  imaging results that were available during my care of the patient were reviewed by me and considered in my medical decision making (  see chart for details).    Patient with recent waxing and itching to vulva area. However, given open lesions, although not circular in shape, will cover for HSV. Culture sent. Patient with recurrent BV, will also call in flagyl. Cytology sent. Return precautions given.  Final Clinical Impressions(s) / UC Diagnoses   Final diagnoses:  Vaginal lesion  Vaginal discharge   ED Prescriptions    Medication Sig Dispense Auth. Provider   valACYclovir (VALTREX) 1000 MG tablet Take 1 tablet (1,000 mg total) by mouth 2 (two) times daily for 7 days. 14 tablet Lonny Eisen V, PA-C   metroNIDAZOLE (FLAGYL) 500 MG tablet Take 1 tablet (500 mg total) by mouth 2 (two) times daily. 14 tablet Belinda Fisher, PA-C     PDMP not reviewed this encounter.   Belinda Fisher, PA-C 10/31/19 1601

## 2019-11-01 ENCOUNTER — Encounter: Payer: Self-pay | Admitting: Obstetrics and Gynecology

## 2019-11-02 ENCOUNTER — Encounter: Payer: Self-pay | Admitting: Obstetrics and Gynecology

## 2019-11-03 ENCOUNTER — Encounter: Payer: Self-pay | Admitting: Obstetrics and Gynecology

## 2019-11-03 LAB — HSV CULTURE AND TYPING

## 2019-11-04 ENCOUNTER — Other Ambulatory Visit: Payer: Self-pay

## 2019-11-04 ENCOUNTER — Other Ambulatory Visit: Payer: Managed Care, Other (non HMO)

## 2019-11-04 ENCOUNTER — Encounter: Payer: Self-pay | Admitting: Obstetrics and Gynecology

## 2019-11-04 DIAGNOSIS — Z113 Encounter for screening for infections with a predominantly sexual mode of transmission: Secondary | ICD-10-CM

## 2019-11-04 DIAGNOSIS — A6004 Herpesviral vulvovaginitis: Secondary | ICD-10-CM

## 2019-11-04 LAB — CERVICOVAGINAL ANCILLARY ONLY
Bacterial vaginitis: POSITIVE — AB
Candida vaginitis: NEGATIVE
Chlamydia: NEGATIVE
Neisseria Gonorrhea: NEGATIVE
Trichomonas: NEGATIVE

## 2019-11-04 MED ORDER — VALACYCLOVIR HCL 500 MG PO TABS: 500.0000 mg | ORAL_TABLET | Freq: Every day | ORAL | 1 refills | Status: DC

## 2019-11-04 NOTE — Addendum Note (Signed)
Addended by: Althea Grimmer B on: 11/04/2019 03:03 PM   Modules accepted: Orders

## 2019-11-04 NOTE — Telephone Encounter (Signed)
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 Questions answered. Will do daily valtrex as preventive. Rx eRxd. Had neg HSV 2 IgG on 6/19 labs. Will also check HIV, RPR, Hep C. Had neg gon/chlam/trich a few days ago with HSV culture.   Pt also with persistent BV. Doing metrogel BID but had pos Gardnerella on recent culture. Discussed IUD removal and change to OCPs for estrogen due to recurrent BV and yeast for a couple yrs. Pt due for annual. Will RTO for annual/BC conf.

## 2019-11-04 NOTE — Telephone Encounter (Signed)
Call late AM

## 2019-11-05 LAB — RPR: RPR Ser Ql: NONREACTIVE

## 2019-11-05 LAB — HIV ANTIBODY (ROUTINE TESTING W REFLEX): HIV Screen 4th Generation wRfx: NONREACTIVE

## 2019-11-05 LAB — HEPATITIS C ANTIBODY: Hep C Virus Ab: <0.1 s/co ratio (ref 0.0–0.9)

## 2019-11-08 ENCOUNTER — Encounter: Payer: Self-pay | Admitting: Obstetrics and Gynecology

## 2019-11-08 ENCOUNTER — Other Ambulatory Visit: Payer: Self-pay | Admitting: Obstetrics and Gynecology

## 2019-11-08 DIAGNOSIS — A6004 Herpesviral vulvovaginitis: Secondary | ICD-10-CM

## 2019-11-08 MED ORDER — VALACYCLOVIR HCL 500 MG PO TABS: 500.0000 mg | ORAL_TABLET | Freq: Every day | ORAL | 1 refills | Status: DC

## 2019-11-08 MED ORDER — METRONIDAZOLE 500 MG PO TABS: 500.0000 mg | ORAL_TABLET | Freq: Two times a day (BID) | ORAL | 0 refills | Status: DC

## 2019-11-08 NOTE — Progress Notes (Signed)
Rx RF valtrex resent to pharm since pt states they didn't have it.

## 2019-11-29 ENCOUNTER — Encounter: Payer: Self-pay | Admitting: Obstetrics and Gynecology

## 2019-11-30 ENCOUNTER — Other Ambulatory Visit: Payer: Self-pay | Admitting: Obstetrics and Gynecology

## 2019-11-30 MED ORDER — MEDROXYPROGESTERONE ACETATE 150 MG/ML IM SUSY: 150.0000 mg | PREFILLED_SYRINGE | Freq: Once | INTRAMUSCULAR | 0 refills | Status: DC

## 2019-11-30 NOTE — Progress Notes (Signed)
Rx depo for [facility]. Coming to office for IUD removal, depo start.

## 2019-12-01 ENCOUNTER — Ambulatory Visit (INDEPENDENT_AMBULATORY_CARE_PROVIDER_SITE_OTHER): Payer: Managed Care, Other (non HMO) | Admitting: Obstetrics and Gynecology

## 2019-12-01 ENCOUNTER — Other Ambulatory Visit: Payer: Self-pay

## 2019-12-01 ENCOUNTER — Encounter: Payer: Self-pay | Admitting: Obstetrics and Gynecology

## 2019-12-01 VITALS — BP 110/70 | Ht 64.0 in | Wt 156.0 lb

## 2019-12-01 DIAGNOSIS — N760 Acute vaginitis: Secondary | ICD-10-CM

## 2019-12-01 DIAGNOSIS — Z1151 Encounter for screening for human papillomavirus (HPV): Secondary | ICD-10-CM

## 2019-12-01 DIAGNOSIS — Z01411 Encounter for gynecological examination (general) (routine) with abnormal findings: Secondary | ICD-10-CM | POA: Diagnosis not present

## 2019-12-01 DIAGNOSIS — Z30013 Encounter for initial prescription of injectable contraceptive: Secondary | ICD-10-CM

## 2019-12-01 DIAGNOSIS — N898 Other specified noninflammatory disorders of vagina: Secondary | ICD-10-CM | POA: Diagnosis not present

## 2019-12-01 DIAGNOSIS — A6004 Herpesviral vulvovaginitis: Secondary | ICD-10-CM | POA: Diagnosis not present

## 2019-12-01 DIAGNOSIS — Z30432 Encounter for removal of intrauterine contraceptive device: Secondary | ICD-10-CM | POA: Diagnosis not present

## 2019-12-01 DIAGNOSIS — B9689 Other specified bacterial agents as the cause of diseases classified elsewhere: Secondary | ICD-10-CM | POA: Diagnosis not present

## 2019-12-01 DIAGNOSIS — Z113 Encounter for screening for infections with a predominantly sexual mode of transmission: Secondary | ICD-10-CM

## 2019-12-01 DIAGNOSIS — Z01419 Encounter for gynecological examination (general) (routine) without abnormal findings: Secondary | ICD-10-CM

## 2019-12-01 DIAGNOSIS — N938 Other specified abnormal uterine and vaginal bleeding: Secondary | ICD-10-CM

## 2019-12-01 DIAGNOSIS — Z124 Encounter for screening for malignant neoplasm of cervix: Secondary | ICD-10-CM

## 2019-12-01 LAB — POCT WET PREP WITH KOH
Clue Cells Wet Prep HPF POC: NEGATIVE
KOH Prep POC: NEGATIVE
Trichomonas, UA: NEGATIVE
Yeast Wet Prep HPF POC: NEGATIVE

## 2019-12-01 MED ORDER — VALACYCLOVIR HCL 500 MG PO TABS: 500.0000 mg | ORAL_TABLET | Freq: Every day | ORAL | 3 refills | Status: DC

## 2019-12-01 MED ORDER — MEDROXYPROGESTERONE ACETATE 150 MG/ML IM SUSY: 150.0000 mg | PREFILLED_SYRINGE | Freq: Once | INTRAMUSCULAR | 3 refills | Status: DC

## 2019-12-01 MED ORDER — METRONIDAZOLE 0.75 % VA GEL: VAGINAL | 2 refills | Status: DC

## 2019-12-01 MED ORDER — MEDROXYPROGESTERONE ACETATE 150 MG/ML IM SUSP
150.0000 mg | Freq: Once | INTRAMUSCULAR | Status: AC
  Administered 2019-12-01: 150 mg via INTRAMUSCULAR

## 2019-12-01 NOTE — Patient Instructions (Signed)
I value your feedback and entrusting us with your care. If you get a [facility] patient survey, I would appreciate you taking the time to let us know about your experience today. Thank you!  As of August 19, 2019, your lab results will be released to your MyChart immediately, before I even have a chance to see them. Please give me time to review them and contact you if there are any abnormalities. Thank you for your patience.  

## 2019-12-01 NOTE — Progress Notes (Signed)
Chief Complaint  Patient presents with  . Gynecologic Exam  . Contraception    IUD removal, depo injection today  . Vaginal Discharge    external burning sensation,no itchiness or odor  . [employer] Employee     HPI:      [name] is a 29 y.o. G0P0000 who LMP was No LMP recorded. (Menstrual status: IUD)., presents today for her annual examination. Her menses are absent with the IUD. Dysmenorrhea none. She does have intermenstrual bleeding.  Sex activity: single partner, contraception - IUD. Mirena placed 08/06/17. Going to have IUD removed and change to depo due to recurrent BV sx.  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 Pt unsure of age of dx of MGM. There is no FH of ovarian cancer. The patient does self-breast exams.  Tobacco use: The patient denies current or previous tobacco use. Alcohol use: social drinker No drug use.  Exercise: not active  She does not get adequate calcium and Vitamin D in her diet.  She had BV and AV 11/18 on One Swab culture, treated with cleocin and avelox with sx relief. Hx of recurrent BV for the past couple of yrs. Sx persist even with metrogel twice wkly as preventive (although pt doesn't always use correctly). Has failed boric acid supp, probiotic use, condoms. Has IUD and question whether that is cause. Removing IUD today. Suggested OCPs for estrogen for vaginal health, but pt would like to try depo instead.   Has increased d/c with sour, non-fishy odor and vaginal burning for a few days. Not doing metrogel twice wkly but is doing boric acid supp. No meds to treat this time yet.  Pt seen 12/20 for anxiety/depression sx and started on zoloft. Pt states she took it initially but then changed her thought process  about school/work/life and is doing better. Stopped zoloft and feels ok off it for now.   Past Medical History:  Diagnosis Date  . Abnormal Pap smear of cervix 2016  . 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 Chest pain   . Constipation   . Dysphagia   . Eosinophilic gastroenteritis   . GERD (gastroesophageal reflux disease)   . History of hiatal hernia   . Ovarian cyst   . UTI (urinary tract infection)     Past Surgical History:  Procedure Laterality Date  . APPENDECTOMY  2012   [facility] FOYDXA  . ESOPHAGOGASTRODUODENOSCOPY  04/02/12   Fields-proximal esophageal web, small hiatal hernia, mild non-H. pylori gastritis, 12.8-16mm savory dilation  . ESOPHAGOGASTRODUODENOSCOPY (EGD) WITH PROPOFOL N/A 08/07/2015   Procedure: ESOPHAGOGASTRODUODENOSCOPY (EGD) WITH PROPOFOL;  Surgeon: Christena Deem, MD;  Location[facility] ENDOSCOPY;  Service: Endoscopy;  Laterality: N/A;  . INTRAUTERINE DEVICE (IUD) INSERTION    . ovarian cyst removed    . uterus tacked  2012    Family History  Problem Relation Age of Onset  . Hypertension Mother   . Breast cancer Maternal Grandmother        ? age of dx  . Breast cancer Maternal Aunt 55  . Diabetes Maternal Aunt        Type 2  . Hypertension Maternal Aunt     Social History   Socioeconomic History  .  Marital status: Single    Spouse name: Not on file  . Number of children: 0  . Years of education: Not on file  . Highest education level: Not on file  Occupational History    Employer: [employer]  Tobacco Use  . Smoking status: Never Smoker  . Smokeless tobacco: Never Used  Substance and Sexual Activity  . Alcohol use: Yes    Comment: RARE  . Drug use: No  . Sexual activity: Yes    Birth control/protection: I.U.D.    Comment: Mirena   Other Topics Concern  . Not on file  Social History Narrative   Lives w/ mother   Social Determinants of Health   Financial Resource Strain:   . Difficulty of Paying Living Expenses:    Food Insecurity:   . Worried About Programme researcher, broadcasting/film/video in the Last Year:   . Barista in the Last Year:   Transportation Needs:   . Freight forwarder (Medical):   Marland Kitchen Lack of Transportation (Non-Medical):   Physical Activity:   . Days of Exercise per Week:   . Minutes of Exercise per Session:   Stress:   . Feeling of Stress :   Social Connections:   . Frequency of Communication with Friends and Family:   . Frequency of Social Gatherings with Friends and Family:   . Attends Religious Services:   . Active Member of Clubs or Organizations:   . Attends Banker Meetings:   Marland Kitchen Marital Status:   Intimate Partner Violence:   . Fear of Current or Ex-Partner:   . Emotionally Abused:   Marland Kitchen Physically Abused:   . Sexually Abused:     Current Outpatient Medications on File Prior to Visit  Medication Sig Dispense Refill  . folic acid (FOLVITE) 1 MG tablet Take 1 mg by mouth daily.    . [DISCONTINUED] azelastine (ASTELIN) 0.1 % nasal spray Place 2 sprays into both nostrils 2 (two) times daily. (Patient not taking: Reported on 10/31/2019) 30 mL 0  . [DISCONTINUED] fluticasone (FLONASE) 50 MCG/ACT nasal spray Place 2 sprays into both nostrils daily. (Patient not taking: Reported on 10/31/2019) 1 g 0  . [DISCONTINUED] sertraline (ZOLOFT) 50 MG tablet Take 1/2 tab daily for 6 days, then 1 tab daily (Patient not taking: Reported on 10/31/2019) 30 tablet 1   No current facility-administered medications on file prior to visit.      ROS:  Review of Systems  Constitutional: Negative for fatigue, fever and unexpected weight change.  Respiratory: Negative for cough, shortness of breath and wheezing.   Cardiovascular: Negative for chest pain, palpitations and leg swelling.  Gastrointestinal: Negative for blood in stool, constipation, diarrhea, nausea and vomiting.  Endocrine: Negative for cold intolerance, heat intolerance and polyuria.  Genitourinary: Positive for vaginal  discharge. Negative for dyspareunia, dysuria, flank pain, frequency, genital sores, hematuria, menstrual problem, pelvic pain, urgency, vaginal bleeding and vaginal pain.  Musculoskeletal: Negative for back pain, joint swelling and myalgias.  Skin: Negative for rash.  Neurological: Negative for dizziness, syncope, light-headedness, numbness and headaches.  Hematological: Negative for adenopathy.  Psychiatric/Behavioral: Negative for agitation, confusion, sleep disturbance and suicidal ideas. The patient is not nervous/anxious.      Objective: BP 110/70   Ht 5\' 4"  (1.626 m)   Wt 156 lb (70.8 kg)   BMI 26.78 kg/m    Physical Exam Constitutional:      Appearance: She is well-developed.  Genitourinary:     Vulva, cervix,  uterus, right adnexa and left adnexa normal.     No vulval lesion or tenderness noted.     Vaginal discharge present.     No vaginal erythema or tenderness.     No cervical polyp.     IUD strings visualized.     Uterus is not enlarged or tender.     No right or left adnexal mass present.     Right adnexa not tender.     Left adnexa not tender.  Neck:     Thyroid: No thyromegaly.  Cardiovascular:     Rate and Rhythm: Normal rate and regular rhythm.     Heart sounds: Normal heart sounds. No murmur.  Pulmonary:     Effort: Pulmonary effort is normal.     Breath sounds: Normal breath sounds.  Chest:     Breasts:        Right: No mass, nipple discharge, skin change or tenderness.        Left: No mass, nipple discharge, skin change or tenderness.  Abdominal:     Palpations: Abdomen is soft.     Tenderness: There is no abdominal tenderness. There is no guarding.  Musculoskeletal:        General: Normal range of motion.     Cervical back: Normal range of motion.  Neurological:     General: No focal deficit present.     Mental Status: She is alert and oriented to person, place, and time.     Cranial Nerves: No cranial nerve deficit.  Skin:    General: Skin is  warm and dry.  Psychiatric:        Mood and Affect: Mood normal.        Behavior: Behavior normal.        Thought Content: Thought content normal.        Judgment: Judgment normal.  Vitals reviewed.    IUD Removal Strings of IUD identified and grasped.  IUD removed without problem with ring forceps.  Pt tolerated this well.  IUD noted to be intact.  Results: Results for orders placed or performed in visit on 12/01/19 (from the past 24 hour(s))  POCT Wet Prep with KOH     Status: Normal   Collection Time: 12/01/19  3:49 PM  Result Value Ref Range   Trichomonas, UA Negative    Clue Cells Wet Prep HPF POC neg    Epithelial Wet Prep HPF POC     Yeast Wet Prep HPF POC neg    Bacteria Wet Prep HPF POC     RBC Wet Prep HPF POC     WBC Wet Prep HPF POC     KOH Prep POC Negative Negative    Assessment/Plan: Encounter for annual routine gynecological examination  Cervical cancer screening - Plan: IGP,CtNgTv,Apt HPV,rfx16/18,45,   Screening for HPV (human papillomavirus) - Plan: IGP,CtNgTv,Apt HPV,rfx16/18,45,   Screening for STD (sexually transmitted disease) - Plan: IGP,CtNgTv,Apt HPV,rfx16/18,45  Encounter for initial prescription of injectable contraceptive - Plan: medroxyPROGESTERone Acetate 150 MG/ML SUSY; Depo start today. Add ca/VIt D supp. Rx RF. RTO in 3 months for 2nd inj. Condoms for 1 wk.  Encounter for IUD removal--pt tolerated well.   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 F/u prn.   Vaginal discharge - Plan: POCT Wet Prep with KOH; Pos sx, neg wet prep for BV, yeast. Question AV given sx. Resume metrogel twice wkly to see if sx improve. If not, will treat with avelox again.  BV (bacterial vaginosis), Chronic--cont metrogel twice wkly, condoms EVERY time, cont probiotics. IUD remove today to see if sx improve. F/u prn.   Meds ordered this encounter  Medications  . medroxyPROGESTERone Acetate 150 MG/ML SUSY    Sig: Inject 1 mL  (150 mg total) into the muscle once for 1 dose.    Dispense:  1 mL    Refill:  3    Order Specific Question:   Supervising Provider    Answer:   Gae Dry [id number]  . valACYclovir (VALTREX) 500 MG tablet    Sig: Take 1 tablet (500 mg total) by mouth daily.    Dispense:  90 tablet    Refill:  3    Order Specific Question:   Supervising Provider    Answer:   Gae Dry [id number]  . metroNIDAZOLE (METROGEL) 0.75 % vaginal gel    Sig: 1 applicatorful vaginally twice wkly as maintenance    Dispense:  50 g    Refill:  2    Order Specific Question:   Supervising Provider    Answer:   Gae Dry [[id number]]             GYN counsel adequate intake of calcium and vitamin D, diet and exercise     F/U  Return in about 1 year (around 11/30/2020).  Alicia B. Copland, PA-C 12/01/2019 4:05 PM

## 2019-12-01 NOTE — Addendum Note (Signed)
Addended by: Donnetta Hail on: 12/01/2019 04:10 PM   Modules accepted: Orders

## 2019-12-03 ENCOUNTER — Encounter: Payer: Self-pay | Admitting: Obstetrics and Gynecology

## 2019-12-05 ENCOUNTER — Other Ambulatory Visit: Payer: Self-pay | Admitting: Obstetrics and Gynecology

## 2019-12-05 MED ORDER — MOXIFLOXACIN HCL 400 MG PO TABS: 400.0000 mg | ORAL_TABLET | Freq: Every day | ORAL | 0 refills | Status: AC

## 2019-12-05 NOTE — Progress Notes (Signed)
Rx RF avelox for BV sx.

## 2019-12-06 ENCOUNTER — Encounter: Payer: Self-pay | Admitting: Obstetrics and Gynecology

## 2019-12-06 LAB — IGP,CTNGTV,APT HPV,RFX16/18,45
Chlamydia, Nuc. Acid Amp: NEGATIVE
Gonococcus, Nuc. Acid Amp: NEGATIVE
HPV Aptima: NEGATIVE
Trich vag by NAA: NEGATIVE

## 2019-12-21 ENCOUNTER — Encounter: Payer: Self-pay | Admitting: Obstetrics and Gynecology

## 2019-12-30 ENCOUNTER — Ambulatory Visit: Payer: Managed Care, Other (non HMO) | Attending: Internal Medicine

## 2019-12-30 DIAGNOSIS — Z23 Encounter for immunization: Secondary | ICD-10-CM

## 2019-12-30 NOTE — Progress Notes (Signed)
   Covid-19 Vaccination Clinic  Name:  OLIVYA SOBOL    MRN: [id number] DOB: 1991/08/18  12/30/2019  Ms. Rolfson was observed post Covid-19 immunization for 15 minutes without incident. She was provided with Vaccine Information Sheet and instruction to access the V-Safe system.   Ms. Kozma was instructed to call 911 with any severe reactions post vaccine: Marland Kitchen Difficulty breathing  . Swelling of face and throat  . A fast heartbeat  . A bad rash all over body  . Dizziness and weakness   Immunizations Administered    Name Date Dose VIS Date Route   Pfizer COVID-19 Vaccine 12/30/2019  8:21 AM 0.3 mL 11/03/2018 Intramuscular   Manufacturer: [employer], [employer]   Lot: [id number]   NDC: [address]-8

## 2020-01-25 ENCOUNTER — Ambulatory Visit: Payer: Managed Care, Other (non HMO) | Attending: Internal Medicine

## 2020-01-25 DIAGNOSIS — Z23 Encounter for immunization: Secondary | ICD-10-CM

## 2020-01-25 NOTE — Progress Notes (Signed)
   Covid-19 Vaccination Clinic  Name:  Beth Peterson    MRN: [id number] DOB: [birth year]  01/25/2020  [name] was observed post Covid-19 immunization for 15 minutes without incident. She was provided with Vaccine Information Sheet and instruction to access the V-Safe system.   Ms. Heater was instructed to call 911 with any severe reactions post vaccine: Marland Kitchen Difficulty breathing  . Swelling of face and throat  . A fast heartbeat  . A bad rash all over body  . Dizziness and weakness   Immunizations Administered    Name Date Dose VIS Date Route   Pfizer COVID-19 Vaccine 01/25/2020  8:03 AM 0.3 mL 11/03/2018 Intramuscular   Manufacturer: [employer], [employer]   Lot: [id number]   NDC: [address]-3

## 2020-02-20 ENCOUNTER — Encounter: Payer: Self-pay | Admitting: Obstetrics and Gynecology

## 2020-02-23 ENCOUNTER — Ambulatory Visit (INDEPENDENT_AMBULATORY_CARE_PROVIDER_SITE_OTHER): Payer: Managed Care, Other (non HMO)

## 2020-02-23 ENCOUNTER — Other Ambulatory Visit: Payer: Self-pay

## 2020-02-23 DIAGNOSIS — Z3042 Encounter for surveillance of injectable contraceptive: Secondary | ICD-10-CM | POA: Diagnosis not present

## 2020-02-23 MED ORDER — MEDROXYPROGESTERONE ACETATE 150 MG/ML IM SUSP
150.0000 mg | Freq: Once | INTRAMUSCULAR | Status: AC
  Administered 2020-02-23: 150 mg via INTRAMUSCULAR

## 2020-02-23 NOTE — Progress Notes (Signed)
Pt here for depo which was given IM right deltoid.  NDC# 0548-5701-00 

## 2020-03-14 ENCOUNTER — Encounter: Payer: Self-pay | Admitting: Obstetrics and Gynecology

## 2020-03-14 ENCOUNTER — Telehealth: Payer: Self-pay

## 2020-03-14 NOTE — Telephone Encounter (Signed)
Pt aware but has made an appt c a primary care provider b/c there are things on the form that we do not do.

## 2020-03-14 NOTE — Telephone Encounter (Signed)
Just drop off form. Thx.

## 2020-03-14 NOTE — Telephone Encounter (Signed)
Pt calling; wants to know if we do PE before clinicals?  [phone]

## 2020-04-05 ENCOUNTER — Encounter: Payer: Self-pay | Admitting: Obstetrics and Gynecology

## 2020-05-06 ENCOUNTER — Other Ambulatory Visit: Payer: Self-pay | Admitting: Obstetrics and Gynecology

## 2020-05-06 ENCOUNTER — Encounter: Payer: Self-pay | Admitting: Obstetrics and Gynecology

## 2020-05-06 DIAGNOSIS — A6004 Herpesviral vulvovaginitis: Secondary | ICD-10-CM

## 2020-05-07 NOTE — Telephone Encounter (Signed)
Pls check on Rx. 1 yr RF sent in 3/21. Thx

## 2020-05-08 NOTE — Telephone Encounter (Signed)
Called pharmacy on chart, pt should be able to pick refill today. Was advised insurance is only paying for 30 tablets at time, not 90. Msg sent via mychart to pt.

## 2020-05-11 ENCOUNTER — Ambulatory Visit (INDEPENDENT_AMBULATORY_CARE_PROVIDER_SITE_OTHER): Payer: Managed Care, Other (non HMO)

## 2020-05-11 ENCOUNTER — Other Ambulatory Visit: Payer: Self-pay

## 2020-05-11 DIAGNOSIS — Z3042 Encounter for surveillance of injectable contraceptive: Secondary | ICD-10-CM

## 2020-05-11 MED ORDER — MEDROXYPROGESTERONE ACETATE 150 MG/ML IM SUSP
150.0000 mg | Freq: Once | INTRAMUSCULAR | Status: AC
  Administered 2020-05-11: 150 mg via INTRAMUSCULAR

## 2020-05-11 NOTE — Progress Notes (Signed)
Patient presents today for Depo Provera injection within dates. Given IM Left Deltoid per patient request. Patient tolerated well. 

## 2020-05-17 ENCOUNTER — Ambulatory Visit: Payer: Managed Care, Other (non HMO)

## 2020-07-05 ENCOUNTER — Other Ambulatory Visit: Payer: Self-pay | Admitting: Obstetrics and Gynecology

## 2020-07-05 ENCOUNTER — Ambulatory Visit (INDEPENDENT_AMBULATORY_CARE_PROVIDER_SITE_OTHER): Payer: Managed Care, Other (non HMO)

## 2020-07-05 ENCOUNTER — Encounter: Payer: Self-pay | Admitting: Obstetrics and Gynecology

## 2020-07-05 ENCOUNTER — Other Ambulatory Visit: Payer: Self-pay

## 2020-07-05 DIAGNOSIS — R300 Dysuria: Secondary | ICD-10-CM | POA: Diagnosis not present

## 2020-07-05 LAB — POCT URINALYSIS DIPSTICK
Bilirubin, UA: NEGATIVE
Glucose, UA: NEGATIVE
Ketones, UA: NEGATIVE
Nitrite, UA: POSITIVE
Protein, UA: POSITIVE — AB
Spec Grav, UA: 1.02 (ref 1.010–1.025)
Urobilinogen, UA: NEGATIVE E.U./dL — AB
pH, UA: 6.5 (ref 5.0–8.0)

## 2020-07-05 MED ORDER — NITROFURANTOIN MONOHYD MACRO 100 MG PO CAPS: 100.0000 mg | ORAL_CAPSULE | Freq: Two times a day (BID) | ORAL | 0 refills | Status: AC

## 2020-07-05 NOTE — Progress Notes (Signed)
Rx macrobid eRxd. Pls notify pt. Send for C&S. Thx

## 2020-07-05 NOTE — Telephone Encounter (Signed)
-----   Message from Rica Records, PA-C sent at 07/05/2020  3:29 PM EDT ----- Rx macrobid eRxd. Pls notify pt. Send for C&S. Thx

## 2020-07-05 NOTE — Progress Notes (Signed)
Rx macrobid for UTI sx, pos UA.

## 2020-07-05 NOTE — Telephone Encounter (Signed)
Pt aware.

## 2020-07-05 NOTE — Progress Notes (Signed)
Pt came to drop off clean catch urine specimen per ABC.  C/o burning c urination.  Please see u/a.  Will send for culture.

## 2020-07-07 IMAGING — US ULTRASOUND ABDOMEN LIMITED
1 series · 14 of 25 positions shown · non-contrast
Comparison: November 25, 2012.

CLINICAL DATA: Abdominal pain with pancreatitis

EXAM:
ULTRASOUND ABDOMEN LIMITED RIGHT UPPER QUADRANT

[Series 1: ultrasound abdomen limited · 14 of 43 slices shown]
[im 1/43]
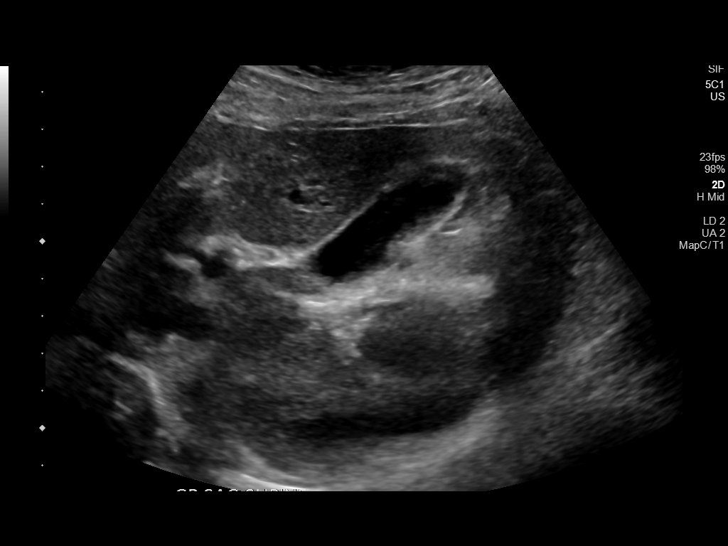
[im 4/43]
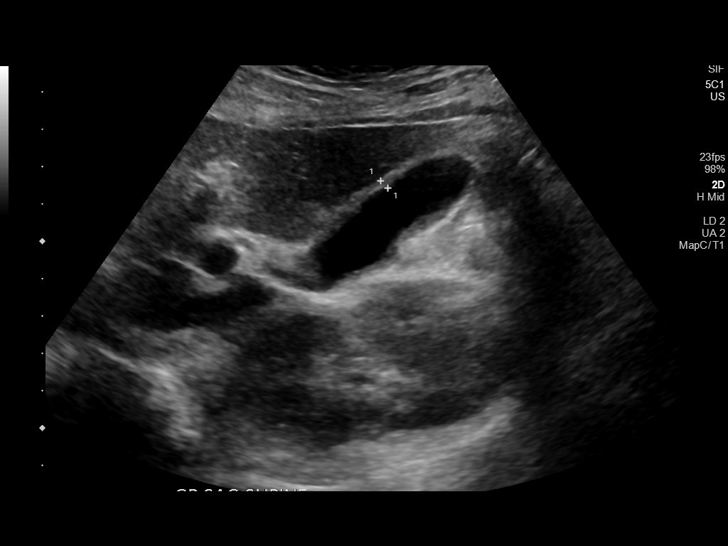
[im 8/43]
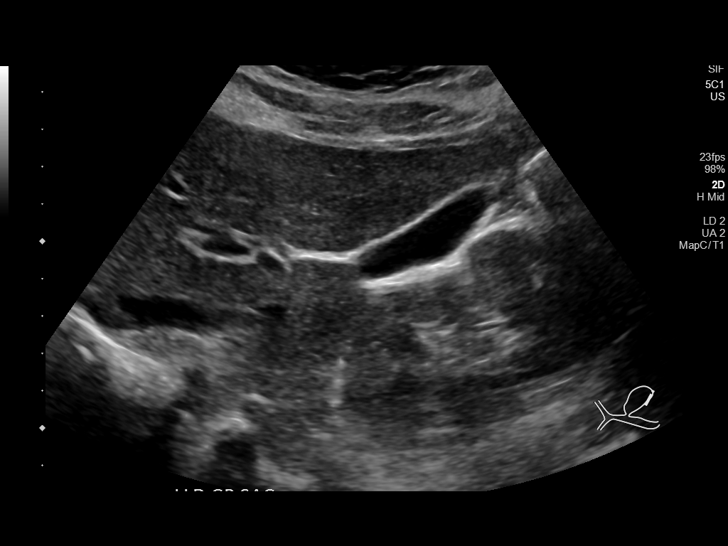
[im 11/43]
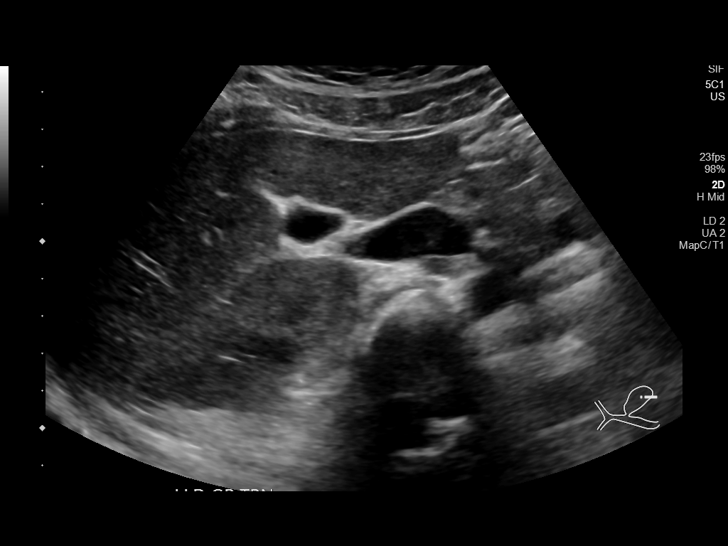
[im 15/43]
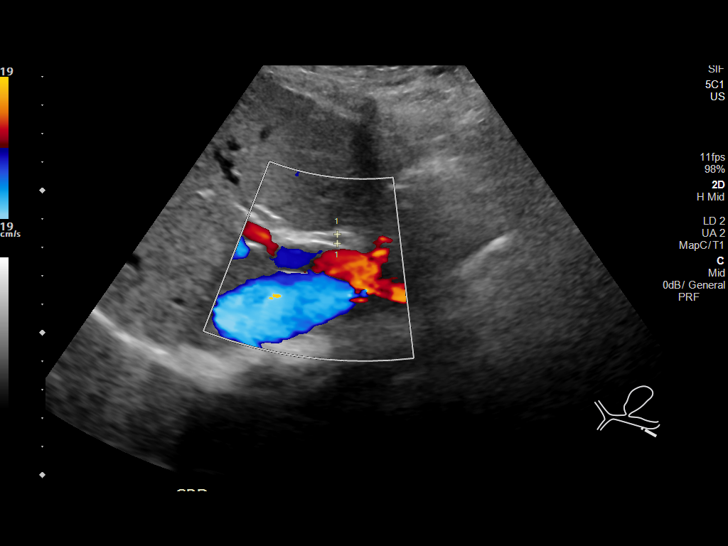
[im 16/43]
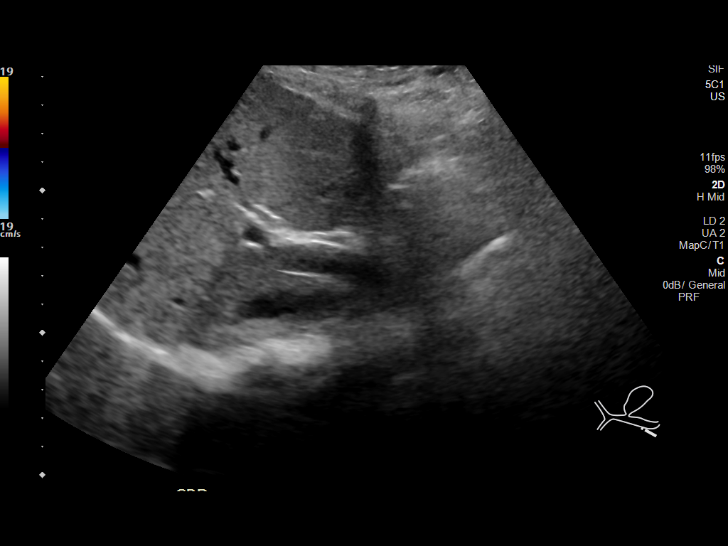
[im 20/43]
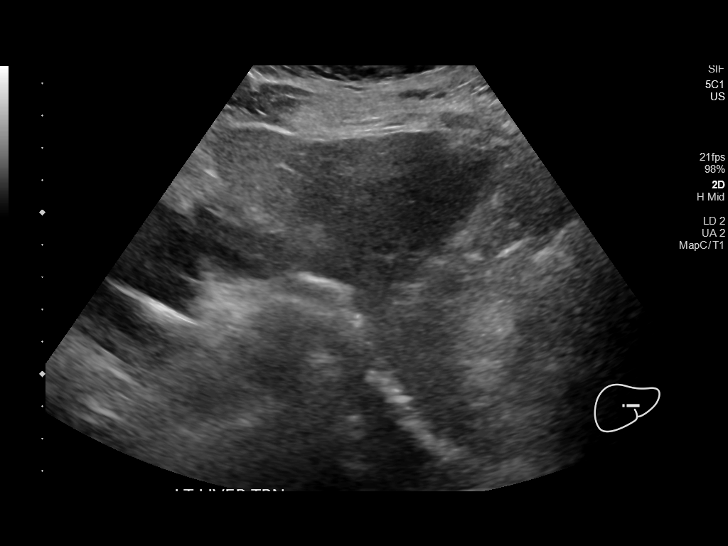
[im 23/43]
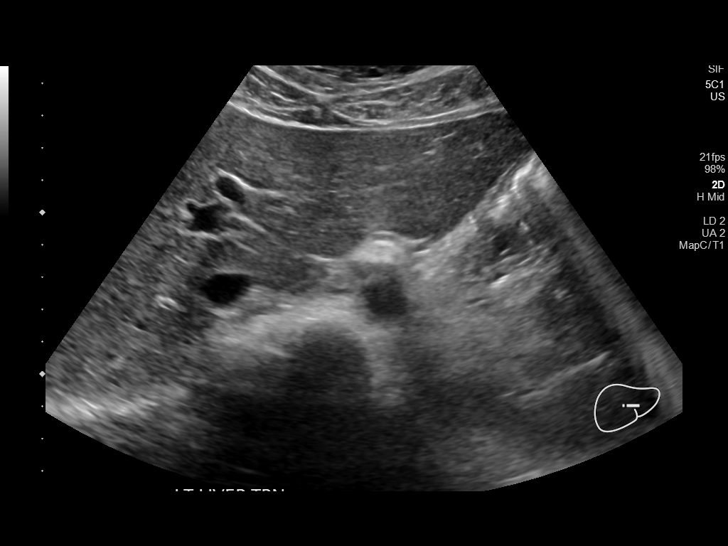
[im 27/43]
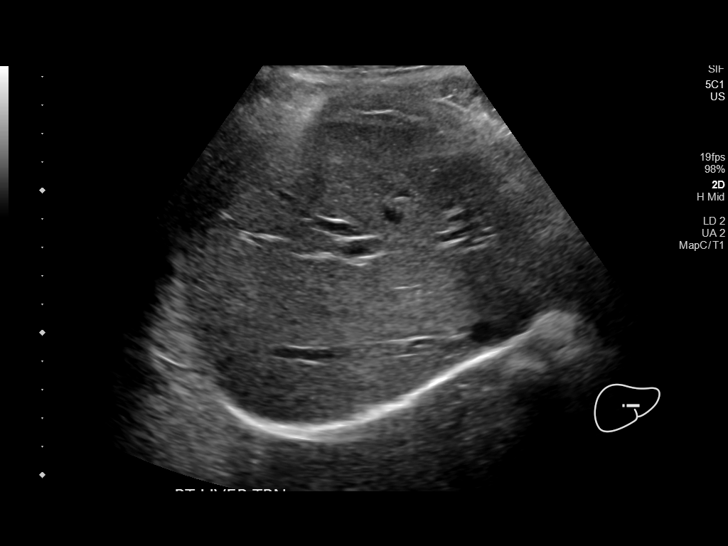
[im 29/43]
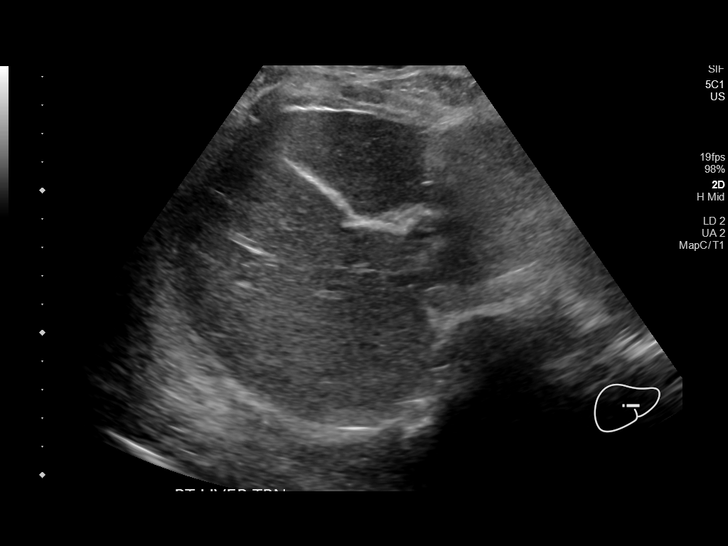
[im 32/43]
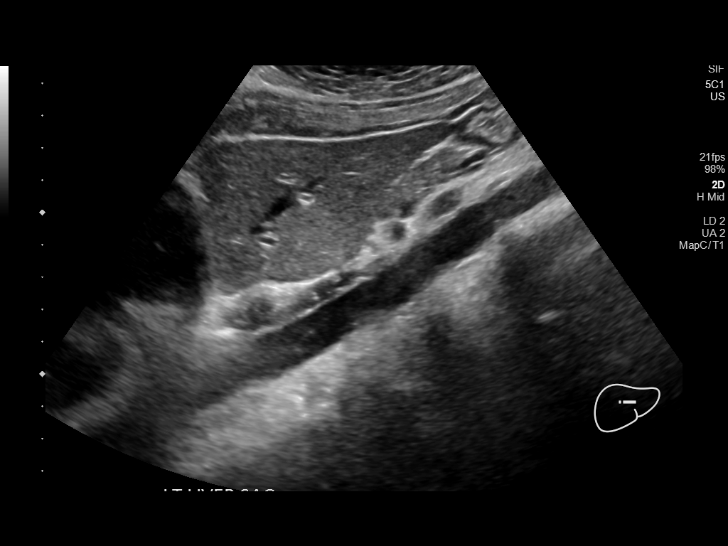
[im 36/43]
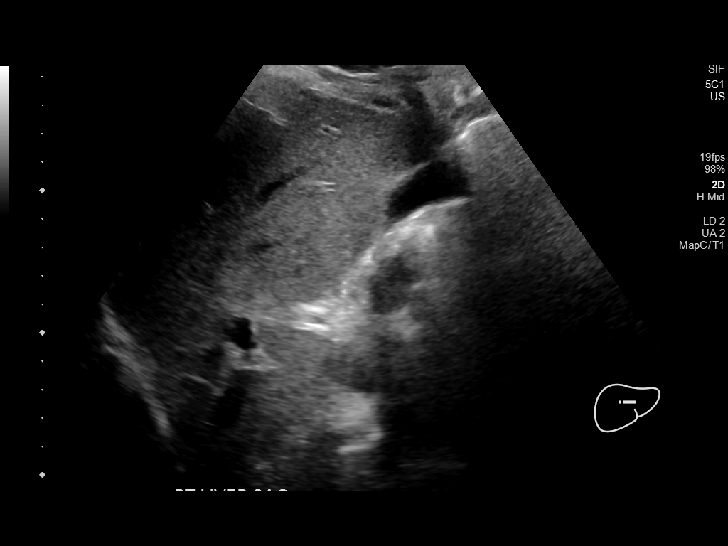
[im 39/43]
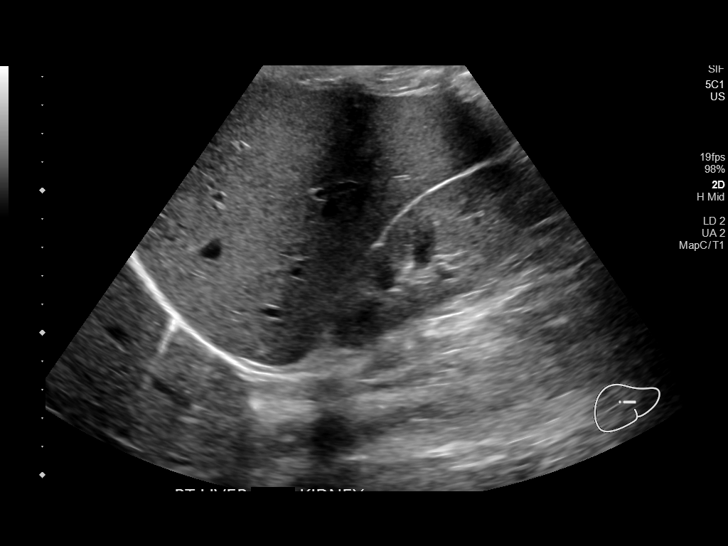
[im 43/43]
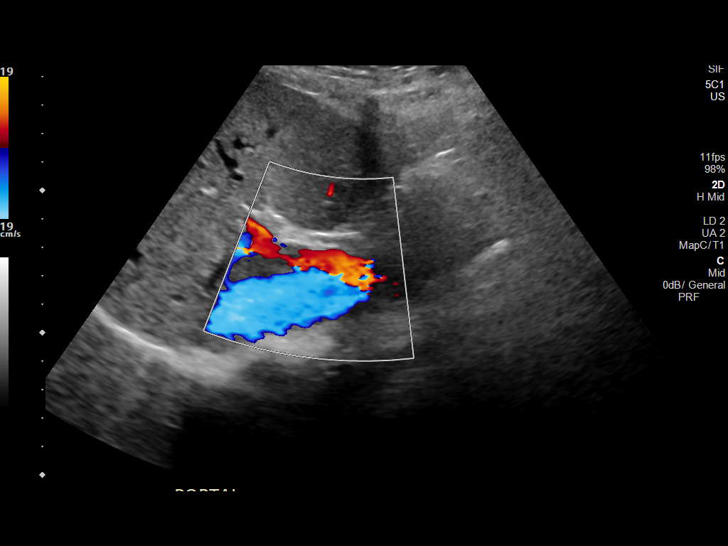

[14 of 25 positions shown; findings below may reference images not displayed]

FINDINGS: Gallbladder:

No gallstones or wall thickening visualized. There is no
pericholecystic fluid. No sonographic Murphy sign noted by
sonographer.

Common bile duct:

Diameter: 3 mm. No intrahepatic or extrahepatic biliary duct
dilatation.

Liver:

No focal lesion identified. Within normal limits in parenchymal
echogenicity. Portal vein is patent on color Doppler imaging with
normal direction of blood flow towards the liver.

Other: Visualized portions of pancreas appear unremarkable.
IMPRESSION: Study within normal limits.

## 2020-07-09 LAB — URINE CULTURE

## 2020-07-31 ENCOUNTER — Other Ambulatory Visit: Payer: Self-pay

## 2020-07-31 ENCOUNTER — Ambulatory Visit (INDEPENDENT_AMBULATORY_CARE_PROVIDER_SITE_OTHER): Payer: Managed Care, Other (non HMO)

## 2020-07-31 ENCOUNTER — Ambulatory Visit: Payer: Managed Care, Other (non HMO)

## 2020-07-31 DIAGNOSIS — Z3042 Encounter for surveillance of injectable contraceptive: Secondary | ICD-10-CM

## 2020-07-31 MED ORDER — MEDROXYPROGESTERONE ACETATE 150 MG/ML IM SUSP
150.0000 mg | Freq: Once | INTRAMUSCULAR | Status: AC
Start: 2020-07-31 — End: 2020-07-31
  Administered 2020-07-31: 150 mg via INTRAMUSCULAR

## 2020-10-05 ENCOUNTER — Encounter: Payer: Self-pay | Admitting: Obstetrics and Gynecology

## 2020-10-16 ENCOUNTER — Ambulatory Visit (INDEPENDENT_AMBULATORY_CARE_PROVIDER_SITE_OTHER): Payer: Managed Care, Other (non HMO)

## 2020-10-16 ENCOUNTER — Other Ambulatory Visit: Payer: Self-pay

## 2020-10-16 DIAGNOSIS — Z3042 Encounter for surveillance of injectable contraceptive: Secondary | ICD-10-CM | POA: Diagnosis not present

## 2020-10-16 MED ORDER — MEDROXYPROGESTERONE ACETATE 150 MG/ML IM SUSP
150.0000 mg | Freq: Once | INTRAMUSCULAR | Status: AC
  Administered 2020-10-16: 150 mg via INTRAMUSCULAR

## 2020-10-16 NOTE — Progress Notes (Signed)
Patient presents today for Depo Provera injection within dates. Given IM LD. Patient tolerated well.  

## 2020-10-23 ENCOUNTER — Ambulatory Visit: Payer: Managed Care, Other (non HMO)

## 2020-11-08 NOTE — Progress Notes (Signed)
Chief Complaint  Patient presents with   Gynecologic Exam    No concerns   [employer] Employee     HPI:      Ms. SADIRA STANDARD is a 30 y.o. G0P0000 who LMP was No LMP recorded. Patient has had an injection., presents today for her annual examination. Her menses are absent with depo. Dysmenorrhea none. She does have intermenstrual bleeding.  Sex activity: not recently, contraception - depo. Had recurrent BV with IUD.  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 Would like full STD testing today.   There is a FH of breast cancer in her mat aunt and MGM, genetic testing not done. Pt unsure of age of dx of MGM but plans to clarify. There is no FH of ovarian cancer. The patient does self-breast exams.  Tobacco use: The patient denies current or previous tobacco use. Alcohol use: social drinker No drug use.  Exercise: mod active  She does not get adequate calcium but does get Vitamin D in her diet.  She had BV and AV 11/18 on One Swab culture, treated with cleocin and avelox with sx relief. Hx of recurrent BV for the past couple of yrs. Sx persisted even with metrogel twice wkly as preventive (although pt didn't always use correctly). Has failed boric acid supp, probiotic use, condoms. Removed IUD and sx much improved/resolved. Still does metrogel once every couple of wks and doing well.   Has noticed increased d/c this wk without irritation/odor. Last used metrogel a few days ago.   Past Medical History:  Diagnosis Date   Abnormal Pap smear of cervix 2016   ASCUS with positive high risk HPV cervical 02/2018   BV (bacterial vaginosis)    Chest pain    Constipation    Dysphagia    Eosinophilic gastroenteritis    GERD (gastroesophageal reflux disease)    History of hiatal hernia    Ovarian cyst    UTI (urinary  tract infection)     Past Surgical History:  Procedure Laterality Date   APPENDECTOMY  2012   [facility]   ESOPHAGOGASTRODUODENOSCOPY  04/02/12   Fields-proximal esophageal web, small hiatal hernia, mild non-H. pylori gastritis, 12.8-16mm savory dilation   ESOPHAGOGASTRODUODENOSCOPY (EGD) WITH PROPOFOL N/A 08/07/2015   Procedure: ESOPHAGOGASTRODUODENOSCOPY (EGD) WITH PROPOFOL;  Surgeon: Christena Deem, MD;  Location: [facility] ENDOSCOPY;  Service: Endoscopy;  Laterality: N/A;   INTRAUTERINE DEVICE (IUD) INSERTION     ovarian cyst removed     uterus tacked  2012    Family History  Problem Relation Age of Onset   Hypertension Mother    Breast cancer Maternal Grandmother        ? age of dx   Breast cancer Maternal Aunt 28   Diabetes Maternal Aunt        Type 2   Hypertension Maternal Aunt     Social History   Socioeconomic History   Marital status: Single    Spouse name: Not on file   Number of children: 0   Years of education: Not on file   Highest education level: Not on file  Occupational History    Employer: [employer]  Tobacco Use   Smoking status: Never Smoker   Smokeless tobacco: Never Used  Vaping Use   Vaping Use: Never used  Substance and Sexual Activity  Alcohol use: Yes    Comment: RARE   Drug use: No   Sexual activity: Not Currently    Birth control/protection: Injection  Other Topics Concern   Not on file  Social History Narrative   Lives w/ mother   Social Determinants of Health   Financial Resource Strain: Not on file  Food Insecurity: Not on file  Transportation Needs: Not on file  Physical Activity: Not on file  Stress: Not on file  Social Connections: Not on file  Intimate Partner Violence: Not on file    Current Outpatient Medications on File Prior to Visit  Medication Sig Dispense Refill   Cholecalciferol (VITAMIN D3) 125 MCG (5000 UT) CAPS See admin instructions.     metroNIDAZOLE (METROGEL) 0.75 %  vaginal gel 1 applicatorful vaginally twice wkly as maintenance 50 g 2   tretinoin (RETIN-A) 0.025 % cream Apply topically. (Patient not taking: Reported on 11/09/2020)     [DISCONTINUED] azelastine (ASTELIN) 0.1 % nasal spray Place 2 sprays into both nostrils 2 (two) times daily. (Patient not taking: Reported on 10/31/2019) 30 mL 0   [DISCONTINUED] fluticasone (FLONASE) 50 MCG/ACT nasal spray Place 2 sprays into both nostrils daily. (Patient not taking: Reported on 10/31/2019) 1 g 0   [DISCONTINUED] sertraline (ZOLOFT) 50 MG tablet Take 1/2 tab daily for 6 days, then 1 tab daily (Patient not taking: Reported on 10/31/2019) 30 tablet 1   No current facility-administered medications on file prior to visit.      ROS:  Review of Systems  Constitutional: Negative for fatigue, fever and unexpected weight change.  Respiratory: Negative for cough, shortness of breath and wheezing.   Cardiovascular: Negative for chest pain, palpitations and leg swelling.  Gastrointestinal: Negative for blood in stool, constipation, diarrhea, nausea and vomiting.  Endocrine: Negative for cold intolerance, heat intolerance and polyuria.  Genitourinary: Positive for vaginal discharge. Negative for dyspareunia, dysuria, flank pain, frequency, genital sores, hematuria, menstrual problem, pelvic pain, urgency, vaginal bleeding and vaginal pain.  Musculoskeletal: Negative for back pain, joint swelling and myalgias.  Skin: Negative for rash.  Neurological: Negative for dizziness, syncope, light-headedness, numbness and headaches.  Hematological: Negative for adenopathy.  Psychiatric/Behavioral: Negative for agitation, confusion, sleep disturbance and suicidal ideas. The patient is not nervous/anxious.      Objective: BP 110/70    Ht 5\' 4"  (1.626 m)    Wt 180 lb (81.6 kg)    BMI 30.90 kg/m    Physical Exam Constitutional:      Appearance: She is well-developed.  Genitourinary:     Vulva normal.     Right Labia:  No rash, tenderness or lesions.    Left Labia: No tenderness, lesions or rash.    Vaginal discharge present.     No vaginal erythema or tenderness.     Vaginal exam comments: FEW CLUMPS OF WHITE D/C.      Right Adnexa: not tender and no mass present.    Left Adnexa: not tender and no mass present.    No cervical friability or polyp.     Uterus is not enlarged or tender.  Breasts:     Right: No mass, nipple discharge, skin change or tenderness.     Left: No mass, nipple discharge, skin change or tenderness.    Neck:     Thyroid: No thyromegaly.  Cardiovascular:     Rate and Rhythm: Normal rate and regular rhythm.     Heart sounds: Normal heart sounds. No murmur heard.   Pulmonary:  Effort: Pulmonary effort is normal.     Breath sounds: Normal breath sounds.  Abdominal:     Palpations: Abdomen is soft.     Tenderness: There is no abdominal tenderness. There is no guarding or rebound.  Musculoskeletal:        General: Normal range of motion.     Cervical back: Normal range of motion.  Lymphadenopathy:     Cervical: No cervical adenopathy.  Neurological:     General: No focal deficit present.     Mental Status: She is alert and oriented to person, place, and time.     Cranial Nerves: No cranial nerve deficit.  Skin:    General: Skin is warm and dry.  Psychiatric:        Mood and Affect: Mood normal.        Behavior: Behavior normal.        Thought Content: Thought content normal.        Judgment: Judgment normal.  Vitals reviewed.    Results: Results for orders placed or performed in visit on 11/09/20 (from the past 24 hour(s))  POCT Wet Prep with KOH     Status: Normal   Collection Time: 11/09/20 11:33 AM  Result Value Ref Range   Trichomonas, UA Negative    Clue Cells Wet Prep HPF POC neg    Epithelial Wet Prep HPF POC     Yeast Wet Prep HPF POC neg    Bacteria Wet Prep HPF POC     RBC Wet Prep HPF POC     WBC Wet Prep HPF POC     KOH Prep POC Negative  Negative    Assessment/Plan: Encounter for annual routine gynecological examination  Screening for STD (sexually transmitted disease) - Plan: HIV Antibody (routine testing w rflx), RPR, Hepatitis C antibody, NuSwab Vaginitis Plus (VG+)  Encounter for surveillance of injectable contraceptive - Plan: medroxyPROGESTERone Acetate 150 MG/ML SUSY; Depo RF. Increase ca/Vit D  Family history of breast cancer--Pt to verify age of dx of MGM. If 50 or older, pt qualifies for cancer genetic testing.   Bacterial vaginosis--no sx recently, improved with IUD removal. Pt still does metrogel periodically. Can stop once finishes Rx.  Vaginal discharge - Plan: NuSwab Vaginitis Plus (VG+), POCT Wet Prep with KOH; neg wet prep. May be due to metrogel use. Rule out yeast/BV with nuswab. Will f/u if abn.  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     Meds ordered this encounter  Medications   valACYclovir (VALTREX) 500 MG tablet    Sig: Take 1 tablet (500 mg total) by mouth daily.    Dispense:  90 tablet    Refill:  3    Order Specific Question:   Supervising Provider    Answer:   Nadara Mustard [[id number]]   medroxyPROGESTERone Acetate 150 MG/ML SUSY    Sig: Inject 1 mL (150 mg total) into the muscle once for 1 dose.    Dispense:  1 mL    Refill:  3    Order Specific Question:   Supervising Provider    Answer:   Nadara Mustard [[id number]]             GYN counsel adequate intake of calcium and vitamin D, diet and exercise     F/U  Return in about 1 year (around 11/09/2021).  Daley Gosse B. Iyania Denne, PA-C 11/09/2020 11:35 AM

## 2020-11-09 ENCOUNTER — Ambulatory Visit (INDEPENDENT_AMBULATORY_CARE_PROVIDER_SITE_OTHER): Payer: Managed Care, Other (non HMO) | Admitting: Obstetrics and Gynecology

## 2020-11-09 ENCOUNTER — Encounter: Payer: Self-pay | Admitting: Obstetrics and Gynecology

## 2020-11-09 ENCOUNTER — Other Ambulatory Visit: Payer: Self-pay

## 2020-11-09 VITALS — BP 110/70 | Ht 64.0 in | Wt 180.0 lb

## 2020-11-09 DIAGNOSIS — Z01419 Encounter for gynecological examination (general) (routine) without abnormal findings: Secondary | ICD-10-CM

## 2020-11-09 DIAGNOSIS — B9689 Other specified bacterial agents as the cause of diseases classified elsewhere: Secondary | ICD-10-CM

## 2020-11-09 DIAGNOSIS — Z803 Family history of malignant neoplasm of breast: Secondary | ICD-10-CM | POA: Diagnosis not present

## 2020-11-09 DIAGNOSIS — A6004 Herpesviral vulvovaginitis: Secondary | ICD-10-CM

## 2020-11-09 DIAGNOSIS — N898 Other specified noninflammatory disorders of vagina: Secondary | ICD-10-CM | POA: Diagnosis not present

## 2020-11-09 DIAGNOSIS — Z3042 Encounter for surveillance of injectable contraceptive: Secondary | ICD-10-CM | POA: Diagnosis not present

## 2020-11-09 DIAGNOSIS — Z113 Encounter for screening for infections with a predominantly sexual mode of transmission: Secondary | ICD-10-CM

## 2020-11-09 DIAGNOSIS — N760 Acute vaginitis: Secondary | ICD-10-CM

## 2020-11-09 LAB — POCT WET PREP WITH KOH
Clue Cells Wet Prep HPF POC: NEGATIVE
KOH Prep POC: NEGATIVE
Trichomonas, UA: NEGATIVE
Yeast Wet Prep HPF POC: NEGATIVE

## 2020-11-09 MED ORDER — VALACYCLOVIR HCL 500 MG PO TABS: 500.0000 mg | ORAL_TABLET | Freq: Every day | ORAL | 3 refills | Status: DC

## 2020-11-09 MED ORDER — MEDROXYPROGESTERONE ACETATE 150 MG/ML IM SUSY: 150.0000 mg | PREFILLED_SYRINGE | Freq: Once | INTRAMUSCULAR | 3 refills | Status: DC

## 2020-11-09 NOTE — Patient Instructions (Signed)
I value your feedback and you entrusting us with your care. If you get a [facility] patient survey, I would appreciate you taking the time to let us know about your experience today. Thank you! ? ? ?

## 2020-11-10 LAB — HIV ANTIBODY (ROUTINE TESTING W REFLEX): HIV Screen 4th Generation wRfx: NONREACTIVE

## 2020-11-10 LAB — RPR: RPR Ser Ql: NONREACTIVE

## 2020-11-10 LAB — HEPATITIS C ANTIBODY: Hep C Virus Ab: <0.1 s/co ratio (ref 0.0–0.9)

## 2020-11-12 LAB — NUSWAB VAGINITIS PLUS (VG+)
Candida albicans, NAA: NEGATIVE
Candida glabrata, NAA: NEGATIVE
Chlamydia trachomatis, NAA: NEGATIVE
Neisseria gonorrhoeae, NAA: NEGATIVE
Trich vag by NAA: NEGATIVE

## 2020-11-13 ENCOUNTER — Encounter: Payer: Self-pay | Admitting: Obstetrics and Gynecology

## 2020-12-17 ENCOUNTER — Encounter: Payer: Self-pay | Admitting: Obstetrics and Gynecology

## 2021-01-01 ENCOUNTER — Ambulatory Visit: Payer: Managed Care, Other (non HMO)

## 2021-01-02 ENCOUNTER — Other Ambulatory Visit: Payer: Self-pay

## 2021-01-02 ENCOUNTER — Ambulatory Visit (INDEPENDENT_AMBULATORY_CARE_PROVIDER_SITE_OTHER): Payer: Managed Care, Other (non HMO)

## 2021-01-02 DIAGNOSIS — Z3042 Encounter for surveillance of injectable contraceptive: Secondary | ICD-10-CM | POA: Diagnosis not present

## 2021-01-02 MED ORDER — MEDROXYPROGESTERONE ACETATE 150 MG/ML IM SUSP
150.0000 mg | Freq: Once | INTRAMUSCULAR | Status: AC
  Administered 2021-01-02: 150 mg via INTRAMUSCULAR

## 2021-01-02 NOTE — Progress Notes (Signed)
Pt here for depo inj which was given IM left deltoid.  NDC# 1117-3567-01

## 2021-01-08 ENCOUNTER — Ambulatory Visit: Payer: Managed Care, Other (non HMO)

## 2021-03-14 ENCOUNTER — Other Ambulatory Visit: Payer: Self-pay | Admitting: Obstetrics and Gynecology

## 2021-03-14 ENCOUNTER — Encounter: Payer: Self-pay | Admitting: Obstetrics and Gynecology

## 2021-03-14 DIAGNOSIS — B9689 Other specified bacterial agents as the cause of diseases classified elsewhere: Secondary | ICD-10-CM

## 2021-03-14 MED ORDER — METRONIDAZOLE 500 MG PO TABS: 500.0000 mg | ORAL_TABLET | Freq: Two times a day (BID) | ORAL | 0 refills | Status: AC

## 2021-03-14 MED ORDER — METRONIDAZOLE 0.75 % VA GEL: VAGINAL | 1 refills | Status: DC

## 2021-03-14 NOTE — Progress Notes (Signed)
Rx RF flagyl for BV and metrogel for prevention. Hx of recurrent BV.

## 2021-03-23 ENCOUNTER — Other Ambulatory Visit: Payer: Self-pay

## 2021-03-23 ENCOUNTER — Ambulatory Visit (INDEPENDENT_AMBULATORY_CARE_PROVIDER_SITE_OTHER): Payer: Managed Care, Other (non HMO)

## 2021-03-23 DIAGNOSIS — Z3042 Encounter for surveillance of injectable contraceptive: Secondary | ICD-10-CM | POA: Diagnosis not present

## 2021-03-23 MED ORDER — MEDROXYPROGESTERONE ACETATE 150 MG/ML IM SUSP
150.0000 mg | Freq: Once | INTRAMUSCULAR | Status: AC
  Administered 2021-03-23: 150 mg via INTRAMUSCULAR

## 2021-03-23 NOTE — Progress Notes (Signed)
Patient presents today for Depo Provera injection within dates. Given IM Right Deltoid per patient request. Patient tolerated well.

## 2021-03-27 ENCOUNTER — Ambulatory Visit: Payer: Managed Care, Other (non HMO)

## 2021-03-30 ENCOUNTER — Encounter: Payer: Self-pay | Admitting: Obstetrics and Gynecology

## 2021-06-08 ENCOUNTER — Ambulatory Visit (INDEPENDENT_AMBULATORY_CARE_PROVIDER_SITE_OTHER): Payer: Managed Care, Other (non HMO)

## 2021-06-08 ENCOUNTER — Other Ambulatory Visit: Payer: Self-pay

## 2021-06-08 DIAGNOSIS — Z3042 Encounter for surveillance of injectable contraceptive: Secondary | ICD-10-CM

## 2021-06-08 MED ORDER — MEDROXYPROGESTERONE ACETATE 150 MG/ML IM SUSP
150.0000 mg | Freq: Once | INTRAMUSCULAR | Status: AC
  Administered 2021-06-08: 150 mg via INTRAMUSCULAR

## 2021-06-08 NOTE — Progress Notes (Signed)
Pt here for depo which was given IM right deltoid.  Pt tolerated inj well.  NDC# 2956-2130-86

## 2021-08-14 ENCOUNTER — Encounter: Payer: Self-pay | Admitting: Obstetrics and Gynecology

## 2021-08-14 ENCOUNTER — Ambulatory Visit: Payer: Managed Care, Other (non HMO) | Admitting: Obstetrics and Gynecology

## 2021-08-14 ENCOUNTER — Other Ambulatory Visit: Payer: Self-pay

## 2021-08-14 VITALS — BP 100/70 | Ht 64.0 in | Wt 171.0 lb

## 2021-08-14 DIAGNOSIS — N898 Other specified noninflammatory disorders of vagina: Secondary | ICD-10-CM

## 2021-08-14 DIAGNOSIS — R399 Unspecified symptoms and signs involving the genitourinary system: Secondary | ICD-10-CM | POA: Diagnosis not present

## 2021-08-14 LAB — POCT URINALYSIS DIPSTICK
Bilirubin, UA: NEGATIVE
Blood, UA: NEGATIVE
Glucose, UA: NEGATIVE
Ketones, UA: POSITIVE
Leukocytes, UA: NEGATIVE
Nitrite, UA: NEGATIVE
Protein, UA: NEGATIVE
Spec Grav, UA: 1.025 (ref 1.010–1.025)
pH, UA: 6 (ref 5.0–8.0)

## 2021-08-14 LAB — POCT WET PREP WITH KOH
Clue Cells Wet Prep HPF POC: NEGATIVE
KOH Prep POC: NEGATIVE
Trichomonas, UA: NEGATIVE
Yeast Wet Prep HPF POC: NEGATIVE

## 2021-08-14 NOTE — Progress Notes (Signed)
Patient, No Pcp Per (Inactive)   Chief Complaint  Patient presents with   Vaginal Discharge    Abnormal odor, no itchiness or irritation   Urinary Tract Infection    Urgency urinating, no burning    HPI:      [name] is a 30 y.o. G0P0000 whose LMP was No LMP recorded. Patient has had an injection., presents today for increased vag d/c with abnormal odor, not necessarily fishy, no itching/irritaiton, for the apst wk. Hx of recurrent BV; has done [employer] as preventive in past, not doing doing. Recurrent sx decreased after IUD removed, doing depo now. Not recently sex active.  Also with urinary frequency and urgency with good flow, for the past wk, no dysuria, hematuria, LBP, pelvic pain, fevers. Drinking more sodas/caffeine recently and less water. 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 12/01/2019   Anxiety and depression 08/17/2019   Sleep deprivation 08/17/2019   Postcoital and contact bleeding 03/31/2018   Bacterial vaginosis 03/31/2018   ASCUS with positive high risk HPV cervical 02/26/2018   Family history of breast cancer 02/10/2018   NSAID long-term use 11/12/2012   Constipation 07/07/2012   Urinary frequency 07/07/2012   Chest pain 05/08/2012   GERD (gastroesophageal reflux disease) 05/08/2012    Past Surgical History:  Procedure Laterality Date   APPENDECTOMY  2012   [facility]   ESOPHAGOGASTRODUODENOSCOPY  04/02/12   Fields-proximal esophageal web, small hiatal hernia, mild non-H. pylori gastritis, 12.8-16mm savory dilation   ESOPHAGOGASTRODUODENOSCOPY (EGD) WITH PROPOFOL N/A 08/07/2015   Procedure: ESOPHAGOGASTRODUODENOSCOPY (EGD) WITH PROPOFOL;  Surgeon: Christena Deem, MD;  Location: [facility] ENDOSCOPY;  Service: Endoscopy;  Laterality: N/A;   INTRAUTERINE DEVICE (IUD) INSERTION      ovarian cyst removed     uterus tacked  2012    Family History  Problem Relation Age of Onset   Hypertension Mother    Breast cancer Maternal Grandmother        ? age of dx   Breast cancer Maternal Aunt 29   Diabetes Maternal Aunt        Type 2   Hypertension Maternal Aunt     Social History   Socioeconomic History   Marital status: Single    Spouse name: Not on file   Number of children: 0   Years of education: Not on file   Highest education level: Not on file  Occupational History    Employer: [employer]  Tobacco Use   Smoking status: Never   Smokeless tobacco: Never  Vaping Use   Vaping Use: Never used  Substance and Sexual Activity   Alcohol use: Yes    Comment: RARE   Drug use: No   Sexual activity: Not Currently    Birth control/protection: Injection  Other Topics Concern   Not on file  Social History Narrative   Lives w/ mother   Social Determinants of Health   Financial Resource Strain: Not on file  Food Insecurity: Not on file  Transportation Needs: Not on file  Physical Activity: Not on file  Stress: Not on file  Social Connections: Not on file  Intimate Partner Violence: Not on file    Outpatient Medications Prior to Visit  Medication Sig Dispense Refill   Cholecalciferol (VITAMIN D3) 125 MCG (5000 UT) CAPS See  admin instructions.     dextroamphetamine (DEXEDRINE SPANSULE) 15 MG 24 hr capsule Take 15 mg by mouth every morning.     doxycycline (VIBRA-TABS) 100 MG tablet Take 100 mg by mouth daily.     LORazepam (ATIVAN) 1 MG tablet Take 1 mg by mouth at bedtime as needed.     metroNIDAZOLE (METROGEL) 0.75 % vaginal gel 1 applicatorful vaginally twice wkly as maintenance 50 g 1   sertraline (ZOLOFT) 25 MG tablet Take 25 mg by mouth daily.     sertraline (ZOLOFT) 50 MG tablet Take 50 mg by mouth daily.     valACYclovir (VALTREX) 500 MG tablet Take 1 tablet (500 mg total) by mouth daily. 90 tablet 3   medroxyPROGESTERone Acetate 150 MG/ML SUSY  Inject 1 mL (150 mg total) into the muscle once for 1 dose. 1 mL 3   tretinoin (RETIN-A) 0.025 % cream Apply topically. (Patient not taking: Reported on 11/09/2020)     No facility-administered medications prior to visit.      ROS:  Review of Systems  Constitutional:  Negative for fever.  Gastrointestinal:  Negative for blood in stool, constipation, diarrhea, nausea and vomiting.  Genitourinary:  Positive for frequency, urgency and vaginal discharge. Negative for dyspareunia, dysuria, flank pain, hematuria, vaginal bleeding and vaginal pain.  Musculoskeletal:  Negative for back pain.  Skin:  Negative for rash.  BREAST: No symptoms   OBJECTIVE:   Vitals:  BP 100/70   Ht 5\' 4"  (1.626 m)   Wt 171 lb (77.6 kg)   BMI 29.35 kg/m   Physical Exam Vitals reviewed.  Constitutional:      Appearance: She is well-developed.  Pulmonary:     Effort: Pulmonary effort is normal.  Genitourinary:    General: Normal vulva.     Pubic Area: No rash.      Labia:        Right: No rash, tenderness or lesion.        Left: No rash, tenderness or lesion.      Vagina: Normal. No vaginal discharge, erythema or tenderness.     Cervix: Normal.     Uterus: Normal. Not enlarged and not tender.      Adnexa: Right adnexa normal and left adnexa normal.       Right: No mass or tenderness.         Left: No mass or tenderness.    Musculoskeletal:        General: Normal range of motion.     Cervical back: Normal range of motion.  Skin:    General: Skin is warm and dry.  Neurological:     General: No focal deficit present.     Mental Status: She is alert and oriented to person, place, and time.  Psychiatric:        Mood and Affect: Mood normal.        Behavior: Behavior normal.        Thought Content: Thought content normal.        Judgment: Judgment normal.    Results: Results for orders placed or performed in visit on 08/14/21 (from the past 24 hour(s))  POCT Urinalysis Dipstick     Status:  Normal   Collection Time: 08/14/21  1:56 PM  Result Value Ref Range   Color, UA amber    Clarity, UA clear    Glucose, UA Negative Negative   Bilirubin, UA neg    Ketones, UA pos    Spec Grav, UA 1.025  1.010 - 1.025   Blood, UA neg    pH, UA 6.0 5.0 - 8.0   Protein, UA Negative Negative   Urobilinogen, UA     Nitrite, UA neg    Leukocytes, UA Negative Negative   Appearance     Odor    POCT Wet Prep with KOH     Status: Normal   Collection Time: 08/14/21  1:57 PM  Result Value Ref Range   Trichomonas, UA Negative    Clue Cells Wet Prep HPF POC neg    Epithelial Wet Prep HPF POC     Yeast Wet Prep HPF POC neg    Bacteria Wet Prep HPF POC     RBC Wet Prep HPF POC     WBC Wet Prep HPF POC     KOH Prep POC Negative Negative     Assessment/Plan:  UTI symptoms - Plan: POCT Urinalysis Dipstick, Urine Culture; pos sx, neg UA. Check C&S. If neg, most likely due to increased caffeine/dehydration. Urine very dark this AM. D/C sodas/caffeine, increase water. F/u prn.   Vaginal discharge - Plan: POCT Wet Prep with KOH; neg exam and neg wet prep. F/u if fishy odor develops or prn.     Return if symptoms worsen or fail to improve.  Chakita Mcgraw B. Mariadelcarmen Corella, PA-C 08/14/2021 1:58 PM

## 2021-08-17 LAB — URINE CULTURE

## 2021-08-24 ENCOUNTER — Ambulatory Visit (INDEPENDENT_AMBULATORY_CARE_PROVIDER_SITE_OTHER): Payer: Managed Care, Other (non HMO)

## 2021-08-24 ENCOUNTER — Other Ambulatory Visit: Payer: Self-pay

## 2021-08-24 DIAGNOSIS — Z3042 Encounter for surveillance of injectable contraceptive: Secondary | ICD-10-CM | POA: Diagnosis not present

## 2021-08-24 MED ORDER — MEDROXYPROGESTERONE ACETATE 150 MG/ML IM SUSP
150.0000 mg | Freq: Once | INTRAMUSCULAR | Status: AC
  Administered 2021-08-24: 150 mg via INTRAMUSCULAR

## 2021-08-24 NOTE — Progress Notes (Signed)
Pt here for depo which was given IM right deltoid.  Pt tolerated well.  NDC# 0548-5701-00 °

## 2021-11-03 ENCOUNTER — Other Ambulatory Visit: Payer: Self-pay | Admitting: Obstetrics and Gynecology

## 2021-11-03 DIAGNOSIS — Z3042 Encounter for surveillance of injectable contraceptive: Secondary | ICD-10-CM

## 2021-11-06 ENCOUNTER — Encounter: Payer: Self-pay | Admitting: Obstetrics and Gynecology

## 2021-11-06 ENCOUNTER — Other Ambulatory Visit: Payer: Self-pay | Admitting: Obstetrics and Gynecology

## 2021-11-06 DIAGNOSIS — Z3042 Encounter for surveillance of injectable contraceptive: Secondary | ICD-10-CM

## 2021-11-12 ENCOUNTER — Other Ambulatory Visit: Payer: Self-pay

## 2021-11-12 ENCOUNTER — Other Ambulatory Visit: Payer: Self-pay | Admitting: Obstetrics and Gynecology

## 2021-11-12 ENCOUNTER — Encounter: Payer: Self-pay | Admitting: Obstetrics and Gynecology

## 2021-11-12 ENCOUNTER — Ambulatory Visit (INDEPENDENT_AMBULATORY_CARE_PROVIDER_SITE_OTHER): Payer: Managed Care, Other (non HMO) | Admitting: Obstetrics and Gynecology

## 2021-11-12 VITALS — Ht 64.0 in | Wt 160.6 lb

## 2021-11-12 DIAGNOSIS — N898 Other specified noninflammatory disorders of vagina: Secondary | ICD-10-CM

## 2021-11-12 DIAGNOSIS — Z01419 Encounter for gynecological examination (general) (routine) without abnormal findings: Secondary | ICD-10-CM | POA: Diagnosis not present

## 2021-11-12 DIAGNOSIS — Z3042 Encounter for surveillance of injectable contraceptive: Secondary | ICD-10-CM

## 2021-11-12 DIAGNOSIS — Z124 Encounter for screening for malignant neoplasm of cervix: Secondary | ICD-10-CM | POA: Diagnosis not present

## 2021-11-12 DIAGNOSIS — R350 Frequency of micturition: Secondary | ICD-10-CM | POA: Diagnosis not present

## 2021-11-12 DIAGNOSIS — Z803 Family history of malignant neoplasm of breast: Secondary | ICD-10-CM

## 2021-11-12 DIAGNOSIS — A6004 Herpesviral vulvovaginitis: Secondary | ICD-10-CM

## 2021-11-12 DIAGNOSIS — Z1151 Encounter for screening for human papillomavirus (HPV): Secondary | ICD-10-CM | POA: Diagnosis not present

## 2021-11-12 LAB — POCT URINALYSIS DIPSTICK
Bilirubin, UA: NEGATIVE
Glucose, UA: NEGATIVE
Ketones, UA: NEGATIVE
Leukocytes, UA: NEGATIVE
Nitrite, UA: NEGATIVE
Protein, UA: NEGATIVE
Spec Grav, UA: 1.02 (ref 1.010–1.025)
pH, UA: 5 (ref 5.0–8.0)

## 2021-11-12 LAB — POCT WET PREP WITH KOH
Clue Cells Wet Prep HPF POC: NEGATIVE
KOH Prep POC: NEGATIVE
Trichomonas, UA: NEGATIVE
Yeast Wet Prep HPF POC: NEGATIVE

## 2021-11-12 MED ORDER — MEDROXYPROGESTERONE ACETATE 150 MG/ML IM SUSP
150.0000 mg | Freq: Once | INTRAMUSCULAR | Status: AC
  Administered 2021-11-12: 150 mg via INTRAMUSCULAR

## 2021-11-12 MED ORDER — MEDROXYPROGESTERONE ACETATE 150 MG/ML IM SUSY: PREFILLED_SYRINGE | INTRAMUSCULAR | 3 refills | Status: DC

## 2021-11-12 MED ORDER — VALACYCLOVIR HCL 500 MG PO TABS: 500.0000 mg | ORAL_TABLET | Freq: Every day | ORAL | 3 refills | Status: DC

## 2021-11-12 NOTE — Patient Instructions (Signed)
I value your feedback and you entrusting us with your care. If you get a [facility] patient survey, I would appreciate you taking the time to let us know about your experience today. Thank you! ? ? ?

## 2021-11-12 NOTE — Progress Notes (Signed)
Chief Complaint  Patient presents with   Annual Exam     HPI:      Beth Peterson is a 31 y.o. G0P0000 who LMP was No LMP recorded (lmp unknown). Patient has had an injection., presents today for her annual examination. Her menses are absent with depo. Dysmenorrhea none. She does have occas intermenstrual bleeding.   Sex activity: not recently, contraception - depo. Had recurrent BV with IUD, improved with depo.  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    There is a FH of breast cancer in her mat aunt and MGM, genetic testing not done. Pt unsure of age of dx of MGM but plans to clarify. There is no FH of ovarian cancer. The patient does not do self-breast exams.   Tobacco use: The patient denies current or previous tobacco use. Alcohol use: social drinker No drug use.  Exercise: min active   She does not get adequate calcium or Vitamin D in her diet.  No recent BV sx. Last treated 7/22. Has had increased vag d/c without fishy odor. Wants checked today to be sure.  She had BV and AV 11/18 on One Swab culture, treated with cleocin and avelox with sx relief. Hx of recurrent BV for the past couple of yrs. Sx persisted even with metrogel twice wkly as preventive (although pt didn't always use correctly). Has failed boric acid supp, probiotic use, condoms. Removed IUD and sx much improved/resolved.   Otilio CarpenOccas has urinary urgency with little flow, no caffeine use. No other UTI sx.   Past Medical History:  Diagnosis Date   Abnormal Pap smear of cervix 2016   ASCUS with positive high risk HPV cervical 02/2018   BV (bacterial vaginosis)    Chest pain    Constipation    Dysphagia    Eosinophilic gastroenteritis    GERD (gastroesophageal reflux disease)    History of hiatal hernia    Ovarian cyst    UTI (urinary tract infection)      Past Surgical History:  Procedure Laterality Date   [facility]CT[facility]Y  2012   [facility]   ESOPHAGOGASTRODUODENOSCOPY  04/02/12   Fields-proximal esophageal web, small hiatal hernia, mild non-H. pylori gastritis, 12.8-16mm savory dilation   ESOPHAGOGASTRODUODENOSCOPY (EGD) WITH PROPOFOL N/A 08/07/2015   Procedure: ESOPHAGOGASTRODUODENOSCOPY (EGD) WITH PROPOFOL;  Surgeon: Christena DeemMartin U Skulskie, MD;  Location: [facility];  Service: Endoscopy;  Laterality: N/A;   INTRAUTERINE DEVICE (IUD) INSERTION     ovarian cyst removed     uterus tacked  2012    Family History  Problem Relation Age of Onset   Hypertension Mother    Breast cancer Maternal Grandmother        ? age of dx   Breast cancer Maternal Aunt [id number]   Diabetes Maternal Aunt        Type 2   Hypertension Maternal Aunt     Social History   Socioeconomic History   Marital status: Single    Spouse name: Not on file   Number of children: 0   Years of education: Not on file   Highest education level: Not on file  Occupational History    Employer: [employer]  Tobacco Use   Smoking status: Never   Smokeless tobacco: Never  Vaping Use   Vaping Use: Never used  Substance  and Sexual Activity   Alcohol use: Yes    Comment: soc   Drug use: No   Sexual activity: Not Currently    Birth control/protection: Injection  Other Topics Concern   Not on file  Social History Narrative   Lives w/ mother   Social Determinants of Health   Financial Resource Strain: Not on file  Food Insecurity: Not on file  Transportation Needs: Not on file  Physical Activity: Not on file  Stress: Not on file  Social Connections: Not on file  Intimate Partner Violence: Not on file    Current Outpatient Medications on File Prior to Visit  Medication Sig Dispense Refill   Cholecalciferol (VITAMIN D3) 125 MCG (5000 UT) CAPS See admin instructions.     doxycycline (VIBRA-TABS) 100 MG tablet Take 100 mg by mouth daily.     LORazepam (ATIVAN) 1  MG tablet Take 1 mg by mouth at bedtime as needed.     metroNIDAZOLE (METROGEL) 0.75 % vaginal gel 1 applicatorful vaginally twice wkly as maintenance 50 g 1   dextroamphetamine (DEXEDRINE SPANSULE) 15 MG 24 hr capsule Take 15 mg by mouth every morning.     sertraline (ZOLOFT) 25 MG tablet Take 25 mg by mouth daily.     sertraline (ZOLOFT) 50 MG tablet Take 50 mg by mouth daily.     [DISCONTINUED] azelastine (ASTELIN) 0.1 % nasal spray Place 2 sprays into both nostrils 2 (two) times daily. (Patient not taking: Reported on 10/31/2019) 30 mL 0   [DISCONTINUED] fluticasone (FLONASE) 50 MCG/ACT nasal spray Place 2 sprays into both nostrils daily. (Patient not taking: Reported on 10/31/2019) 1 g 0   No current facility-administered medications on file prior to visit.      ROS:  Review of Systems  Constitutional:  Negative for fatigue, fever and unexpected weight change.  Respiratory:  Negative for cough, shortness of breath and wheezing.   Cardiovascular:  Negative for chest pain, palpitations and leg swelling.  Gastrointestinal:  Negative for blood in stool, constipation, diarrhea, nausea and vomiting.  Endocrine: Negative for cold intolerance, heat intolerance and polyuria.  Genitourinary:  Positive for frequency, urgency and vaginal discharge. Negative for dyspareunia, dysuria, flank pain, genital sores, hematuria, menstrual problem, pelvic pain, vaginal bleeding and vaginal pain.  Musculoskeletal:  Negative for back pain, joint swelling and myalgias.  Skin:  Negative for rash.  Neurological:  Negative for dizziness, syncope, light-headedness, numbness and headaches.  Hematological:  Negative for adenopathy.  Psychiatric/Behavioral:  Negative for agitation, confusion, sleep disturbance and suicidal ideas. The patient is not nervous/anxious.     Objective: Ht 5\' 4"  (1.626 m)    Wt 160 lb 9.6 oz (72.8 kg)    LMP  (LMP Unknown)    BMI 27.57 kg/m    Physical Exam Constitutional:       Appearance: She is well-developed.  Genitourinary:     Vulva normal.     Right Labia: No rash, tenderness or lesions.    Left Labia: No tenderness, lesions or rash.    Vaginal bleeding present.     No vaginal discharge, erythema or tenderness.      Right Adnexa: not tender and no mass present.    Left Adnexa: not tender and no mass present.    No cervical friability or polyp.     Uterus is not enlarged or tender.  Breasts:    Right: No mass, nipple discharge, skin change or tenderness.     Left: No mass, nipple discharge, skin  change or tenderness.  Neck:     Thyroid: No thyromegaly.  Cardiovascular:     Rate and Rhythm: Normal rate and regular rhythm.     Heart sounds: Normal heart sounds. No murmur heard. Pulmonary:     Effort: Pulmonary effort is normal.     Breath sounds: Normal breath sounds.  Abdominal:     Palpations: Abdomen is soft.     Tenderness: There is no abdominal tenderness. There is no guarding or rebound.  Musculoskeletal:        General: Normal range of motion.     Cervical back: Normal range of motion.  Lymphadenopathy:     Cervical: No cervical adenopathy.  Neurological:     General: No focal deficit present.     Mental Status: She is alert and oriented to person, place, and time.     Cranial Nerves: No cranial nerve deficit.  Skin:    General: Skin is warm and dry.  Psychiatric:        Mood and Affect: Mood normal.        Behavior: Behavior normal.        Thought Content: Thought content normal.        Judgment: Judgment normal.  Vitals reviewed.   Results: Results for orders placed or performed in visit on 11/12/21 (from the past 24 hour(s))  POCT Urinalysis Dipstick     Status: Normal   Collection Time: 11/12/21 11:03 AM  Result Value Ref Range   Color, UA yellow    Clarity, UA cloudy    Glucose, UA Negative Negative   Bilirubin, UA neg    Ketones, UA neg    Spec Grav, UA 1.020 1.010 - 1.025   Blood, UA mod    pH, UA 5.0 5.0 - 8.0    Protein, UA Negative Negative   Urobilinogen, UA     Nitrite, UA neg    Leukocytes, UA Negative Negative   Appearance     Odor    POCT Wet Prep with KOH     Status: Normal   Collection Time: 11/12/21 11:03 AM  Result Value Ref Range   Trichomonas, UA Negative    Clue Cells Wet Prep HPF POC neg    Epithelial Wet Prep HPF POC     Yeast Wet Prep HPF POC neg    Bacteria Wet Prep HPF POC     RBC Wet Prep HPF POC     WBC Wet Prep HPF POC     KOH Prep POC Negative Negative  PT WITH MENSTRUAL BLEEDING   Assessment/Plan:  Encounter for annual routine gynecological examination   Cervical cancer screening - Plan: IGP, Aptima HPV  Screening for HPV (human papillomavirus) - Plan: IGP, Aptima HPV  Encounter for surveillance of injectable contraceptive - Plan: medroxyPROGESTERone (DEPO-PROVERA) injection 150 mg, medroxyPROGESTERone Acetate 150 MG/ML SUSY; Rx RF. Increase ca/Vit D with supp.   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 If 50 or under, qualifies for cancer genetic testing  Vaginal discharge - Plan: POCT Wet Prep with KOH; neg wet prep. Reassurance. Can do boric acid supp prn.  F/u prn.   Urinary frequency - Plan: POCT Urinalysis Dipstick; neg UA. Increase water intake, f/u prn.    Meds ordered this encounter  Medications   medroxyPROGESTERone (DEPO-PROVERA) injection 150 mg   valACYclovir (VALTREX) 500 MG tablet  Sig: Take 1 tablet (500 mg total) by mouth daily.    Dispense:  90 tablet    Refill:  3    Order Specific Question:   Supervising Provider    Answer:   Nadara Mustard [[id number]]   medroxyPROGESTERone Acetate 150 MG/ML SUSY    Sig: INJECT 1 ML INTO THE MUSCLE ONCE FOR 1 DOSE.    Dispense:  1 mL    Refill:  3    Order Specific Question:   Supervising Provider    Answer:   Nadara Mustard [[id number]]             GYN counsel  adequate intake of calcium and vitamin D, diet and exercise     F/U  Return in about 1 year (around 11/13/2022).  [facility] B. Shakaya Bhullar, PA-C 11/12/2021 11:04 AM

## 2021-11-13 ENCOUNTER — Encounter: Payer: Self-pay | Admitting: Obstetrics and Gynecology

## 2021-11-26 ENCOUNTER — Encounter: Payer: Self-pay | Admitting: Obstetrics and Gynecology

## 2021-11-26 LAB — IGP, APTIMA HPV: HPV Aptima: POSITIVE — AB

## 2021-12-14 ENCOUNTER — Encounter: Payer: Self-pay | Admitting: Obstetrics and Gynecology

## 2021-12-17 ENCOUNTER — Other Ambulatory Visit: Payer: Self-pay | Admitting: Obstetrics and Gynecology

## 2021-12-17 MED ORDER — ESTRADIOL 1 MG PO TABS: 1.0000 mg | ORAL_TABLET | Freq: Every day | ORAL | 0 refills | Status: DC

## 2021-12-17 NOTE — Progress Notes (Signed)
Rx estradiol for BTB with depo 

## 2022-01-13 ENCOUNTER — Encounter: Payer: Self-pay | Admitting: Obstetrics and Gynecology

## 2022-01-29 ENCOUNTER — Ambulatory Visit (INDEPENDENT_AMBULATORY_CARE_PROVIDER_SITE_OTHER): Payer: Managed Care, Other (non HMO)

## 2022-01-29 DIAGNOSIS — Z3042 Encounter for surveillance of injectable contraceptive: Secondary | ICD-10-CM

## 2022-01-29 MED ORDER — MEDROXYPROGESTERONE ACETATE 150 MG/ML IM SUSP
150.0000 mg | Freq: Once | INTRAMUSCULAR | Status: AC
  Administered 2022-01-29: 150 mg via INTRAMUSCULAR

## 2022-01-29 NOTE — Progress Notes (Signed)
Patient presents today for Depo Provera injection within dates. Given IM Right Deltoid. Patient tolerated well. 

## 2022-02-27 ENCOUNTER — Encounter: Payer: Self-pay | Admitting: Obstetrics and Gynecology

## 2022-03-05 ENCOUNTER — Telehealth: Payer: Self-pay | Admitting: Obstetrics and Gynecology

## 2022-03-05 ENCOUNTER — Encounter: Payer: Self-pay | Admitting: Obstetrics and Gynecology

## 2022-03-05 ENCOUNTER — Ambulatory Visit: Payer: Managed Care, Other (non HMO) | Admitting: Obstetrics and Gynecology

## 2022-03-05 VITALS — BP 100/70 | Ht 64.0 in | Wt 153.0 lb

## 2022-03-05 DIAGNOSIS — R3915 Urgency of urination: Secondary | ICD-10-CM

## 2022-03-05 LAB — POCT URINALYSIS DIPSTICK
Bilirubin, UA: NEGATIVE
Blood, UA: NEGATIVE
Glucose, UA: NEGATIVE
Ketones, UA: NEGATIVE
Leukocytes, UA: NEGATIVE
Nitrite, UA: NEGATIVE
Protein, UA: NEGATIVE
Spec Grav, UA: 1.025 (ref 1.010–1.025)
pH, UA: 5 (ref 5.0–8.0)

## 2022-03-05 MED ORDER — URIBEL 118 MG PO CAPS
118.0000 mg | ORAL_CAPSULE | Freq: Four times a day (QID) | ORAL | 0 refills | Status: DC | PRN
Start: 2022-03-05 — End: 2022-11-14

## 2022-03-05 NOTE — Telephone Encounter (Signed)
Called pt, no answer, LVMTRC. 

## 2022-03-06 NOTE — Telephone Encounter (Signed)
Pt aware. She wants the Rx sent to [facility] in [place]. Per Crystal, pt needs to request [facility] to transfer Rx to her choice of pharmacy. Pt aware.

## 2022-03-06 NOTE — Telephone Encounter (Signed)
Pt agrees to stop adderall while taking uribel. Contacted pharmacy so they can fill Rx. Was advised by pharmacy med not in stock and they can probably get it by tomorrow. Called pt to advise, no answer, LVMTRC.

## 2022-03-07 LAB — URINE CULTURE

## 2022-03-08 ENCOUNTER — Telehealth: Payer: Self-pay | Admitting: Obstetrics and Gynecology

## 2022-03-08 MED ORDER — FLUCONAZOLE 150 MG PO TABS: 150.0000 mg | ORAL_TABLET | Freq: Once | ORAL | 0 refills | Status: AC

## 2022-03-11 NOTE — Telephone Encounter (Signed)
Rx diflucan for yeast vag sx after abx use 

## 2022-04-16 ENCOUNTER — Ambulatory Visit (INDEPENDENT_AMBULATORY_CARE_PROVIDER_SITE_OTHER): Payer: Managed Care, Other (non HMO)

## 2022-04-16 DIAGNOSIS — Z3042 Encounter for surveillance of injectable contraceptive: Secondary | ICD-10-CM

## 2022-04-16 MED ORDER — MEDROXYPROGESTERONE ACETATE 150 MG/ML IM SUSP
150.0000 mg | Freq: Once | INTRAMUSCULAR | Status: AC
  Administered 2022-04-16: 150 mg via INTRAMUSCULAR

## 2022-04-16 NOTE — Patient Instructions (Signed)
Medroxyprogesterone Injection (Contraception) ?What is this medication? ?MEDROXYPROGESTERONE (me DROX ee proe JES te rone) prevents ovulation and pregnancy. It belongs to a group of medications called contraceptives. This medication is a progestin hormone. ?This medicine may be used for other purposes; ask your health care provider or pharmacist if you have questions. ?COMMON BRAND NAME(S): Depo-Provera, Depo-subQ Provera 104 ?What should I tell my care team before I take this medication? ?They need to know if you have any of these conditions: ?Asthma ?Blood clots ?Breast cancer or family history of breast cancer ?Depression ?Diabetes ?Eating disorder (anorexia nervosa) ?Heart attack ?High blood pressure ?HIV infection or AIDS ?If you often drink alcohol ?Kidney disease ?Liver disease ?Migraine headaches ?Osteoporosis, weak bones ?Seizures ?Stroke ?Tobacco smoker ?Vaginal bleeding ?An unusual or allergic reaction to medroxyprogesterone, other hormones, medications, foods, dyes, or preservatives ?Pregnant or trying to get pregnant ?Breast-feeding ?How should I use this medication? ?Depo-Provera CI contraceptive injection is given into a muscle. Depo-subQ Provera 104 injection is given under the skin. It is given in a hospital or clinic setting. The injection is usually given during the first 5 days after the start of a menstrual period or 6 weeks after delivery of a baby. ?A patient package insert for the product will be given with each prescription and refill. Be sure to read this information carefully each time. The sheet may change often. ?Talk to your care team about the use of this medication in children. Special care may be needed. These injections have been used in female children who have started having menstrual periods. ?Overdosage: If you think you have taken too much of this medicine contact a poison control center or emergency room at once. ?NOTE: This medicine is only for you. Do not share this medicine  with others. ?What if I miss a dose? ?Keep appointments for follow-up doses. You must get an injection once every 3 months. It is important not to miss your dose. Call your care team if you are unable to keep an appointment. ?What may interact with this medication? ?Antibiotics or medications for infections, especially rifampin and griseofulvin ?Antivirals for HIV or hepatitis ?Aprepitant ?Armodafinil ?Bexarotene ?Bosentan ?Medications for seizures like carbamazepine, felbamate, oxcarbazepine, phenytoin, phenobarbital, primidone, topiramate ?Mitotane ?Modafinil ?St. John's wort ?This list may not describe all possible interactions. Give your health care provider a list of all the medicines, herbs, non-prescription drugs, or dietary supplements you use. Also tell them if you smoke, drink alcohol, or use illegal drugs. Some items may interact with your medicine. ?What should I watch for while using this medication? ?This medication does not protect you against HIV infection (AIDS) or other sexually transmitted diseases. ?Use of this product may cause you to lose calcium from your bones. Loss of calcium may cause weak bones (osteoporosis). Only use this product for more than 2 years if other forms of birth control are not right for you. The longer you use this product for birth control the more likely you will be at risk for weak bones. Ask your care team how you can keep strong bones. ?You may have a change in bleeding pattern or irregular periods. Many females stop having periods while taking this medication. ?If you have received your injections on time, your chance of being pregnant is very low. If you think you may be pregnant, see your care team as soon as possible. ?Tell your care team if you want to get pregnant within the next year. The effect of this medication may last a   long time after you get your last injection. ?What side effects may I notice from receiving this medication? ?Side effects that you should  report to your care team as soon as possible: ?Allergic reactions--skin rash, itching, hives, swelling of the face, lips, tongue, or throat ?Blood clot--pain, swelling, or warmth in the leg, shortness of breath, chest pain ?Gallbladder problems--severe stomach pain, nausea, vomiting, fever ?Increase in blood pressure ?Liver injury--right upper belly pain, loss of appetite, nausea, light-colored stool, dark yellow or brown urine, yellowing skin or eyes, unusual weakness or fatigue ?New or worsening migraines or headaches ?Seizures ?Stroke--sudden numbness or weakness of the face, arm, or leg, trouble speaking, confusion, trouble walking, loss of balance or coordination, dizziness, severe headache, change in vision ?Unusual vaginal discharge, itching, or odor ?Worsening mood, feelings of depression ?Side effects that usually do not require medical attention (report to your care team if they continue or are bothersome): ?Breast pain or tenderness ?Dark patches of the skin on the face or other sun-exposed areas ?Irregular menstrual cycles or spotting ?Nausea ?Weight gain ?This list may not describe all possible side effects. Call your doctor for medical advice about side effects. You may report side effects to FDA at 1-800-FDA-1088. ?Where should I keep my medication? ?This injection is only given by a care team. It will not be stored at home. ?NOTE: This sheet is a summary. It may not cover all possible information. If you have questions about this medicine, talk to your doctor, pharmacist, or health care provider. ?? 2023 Elsevier/Gold Standard (2020-10-29 00:00:00) ? ?

## 2022-04-16 NOTE — Progress Notes (Signed)
Date last pap: 11/12/2021. Last Depo-Provera: 01/29/2022. Side Effects if any: none reported. Serum HCG indicated? N/A. Depo-Provera 150 mg IM given by: Nicholos Johns. W, NCMA. Next appointment due 10/25-11/8/23.

## 2022-04-20 ENCOUNTER — Encounter: Payer: Self-pay | Admitting: Obstetrics and Gynecology

## 2022-07-02 ENCOUNTER — Ambulatory Visit: Payer: Managed Care, Other (non HMO)

## 2022-07-02 VITALS — BP 117/76 | HR 86 | Ht 64.0 in | Wt 151.0 lb

## 2022-07-02 DIAGNOSIS — Z3042 Encounter for surveillance of injectable contraceptive: Secondary | ICD-10-CM | POA: Diagnosis not present

## 2022-07-02 MED ORDER — MEDROXYPROGESTERONE ACETATE 150 MG/ML IM SUSP
150.0000 mg | Freq: Once | INTRAMUSCULAR | Status: AC
  Administered 2022-07-02: 150 mg via INTRAMUSCULAR

## 2022-07-02 NOTE — Addendum Note (Signed)
Addended by: Landis Gandy on: 07/02/2022 10:21 AM   Modules accepted: Level of Service

## 2022-07-02 NOTE — Progress Notes (Signed)
Date last pap: n/a. Last Depo-Provera: 04/16/2022. Side Effects if any: n/a. Serum HCG indicated? N/a. Depo-Provera 150 mg IM given by: Levert Feinstein CMA.

## 2022-09-17 ENCOUNTER — Ambulatory Visit (INDEPENDENT_AMBULATORY_CARE_PROVIDER_SITE_OTHER): Payer: Managed Care, Other (non HMO)

## 2022-09-17 VITALS — BP 115/77 | HR 82 | Wt 146.6 lb

## 2022-09-17 DIAGNOSIS — Z3042 Encounter for surveillance of injectable contraceptive: Secondary | ICD-10-CM

## 2022-09-17 MED ORDER — MEDROXYPROGESTERONE ACETATE 150 MG/ML IM SUSP
150.0000 mg | Freq: Once | INTRAMUSCULAR | Status: AC
  Administered 2022-09-17: 150 mg via INTRAMUSCULAR

## 2022-09-17 NOTE — Progress Notes (Addendum)
    NURSE VISIT NOTE  Subjective:    Patient ID: Beth Peterson, female    DOB: August 25, 1991, 32 y.o.   MRN: [id number]  HPI  Patient is a 32 y.o. G0P0000 female who presents for depo provera injection.   Objective:    BP 115/77   Pulse 82   Wt 146 lb 9.6 oz (66.5 kg)   BMI 25.16 kg/m   Date last pap: N/A. Last Depo-Provera: 07/02/2022. Side Effects if any: none Serum HCG indicated?    . Depo-Provera 150 mg IM given by: Otelia Limes, CMA. Site: Left Deltoid  Lab Review  @THIS  VISIT ONLY@  Assessment:   1. Encounter for surveillance of injectable contraceptive      Plan:   Next appointment due between March 27 thru April 10.    Inis Sizer, CMA

## 2022-11-13 NOTE — Progress Notes (Addendum)
Chief Complaint  Patient presents with   Gynecologic Exam   Contraception    Discuss coming off birth control    Urinary Tract Infection    Urgency, no frequency or burning      HPI:      [name] is a 32 y.o. G0P0000 who LMP was No LMP recorded. Patient has had an injection., presents today for her annual examination. Her menses are absent with depo. Dysmenorrhea occas, does have occas BTB when close to next depo inj/with BV sx. Pt complaining of increased acne/facial hair. No hx of PCOS. May want to stop depo vs change BC.    Sex activity: occasionally sexually active; contraception - depo. Had recurrent BV with IUD, improved with depo but sex is still trigger. Not doing condoms. Does metrogel RF prn. Taking probiotics, occas does boric acid supp. Had sx a few wks ago and treated with flagyl. No sx today. Wants full STD testing.   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 Needs Rx RF.    There is a FH of breast cancer in her mat aunt, genetic testing not done and pancreatic cancer in her other mat aunt. There is no FH of ovarian cancer. The patient does not do self-breast exams.   Tobacco use: The patient denies current or previous tobacco use. Alcohol use: social drinker No drug use.  Exercise: mod active   She does get adequate calcium and Vitamin D in her diet.  She had BV and AV 11/18 on One Swab culture, treated with cleocin and avelox with sx relief. Hx of recurrent BV for the past couple of yrs. Sx persisted even with metrogel twice wkly as preventive (although pt didn't always use correctly). Had failed boric acid supp, probiotic use, condoms and then removed IUD and sx much improved/resolved.   Still has intermittent urinary urgency with little flow sometimes, no caffeine use. Drinks ~64 oz water daily. No  other UTI sx. Has started voiding more frequently and not holding urine and sx a little improved. Has nocturia as well. Works 3rd shift. Saw urology in past with neg testing.   Past Medical History:  Diagnosis Date   Abnormal Pap smear of cervix 2016   ASCUS with positive high risk HPV cervical 02/2018   BV (bacterial vaginosis)    Chest pain    Constipation    Dysphagia    Eosinophilic gastroenteritis    GERD (gastroesophageal reflux disease)    History of hiatal hernia    Ovarian cyst    UTI (urinary tract infection)     Past Surgical History:  Procedure Laterality Date   APPENDECTOMY  2012   [facility]   ESOPHAGOGASTRODUODENOSCOPY  04/02/12   Fields-proximal esophageal web, small hiatal hernia, mild non-H. pylori gastritis, 12.8-16mm savory dilation   ESOPHAGOGASTRODUODENOSCOPY (EGD) WITH PROPOFOL N/A 08/07/2015   Procedure: ESOPHAGOGASTRODUODENOSCOPY (EGD) WITH PROPOFOL;  Surgeon: Lollie Sails, MD;  Location: [facility] ENDOSCOPY;  Service: Endoscopy;  Laterality: N/A;   INTRAUTERINE DEVICE (IUD) INSERTION     ovarian cyst removed     uterus tacked  2012    Family History  Problem Relation Age of Onset   Hypertension Mother    Pancreatic cancer Maternal Aunt        around 16   Diabetes Maternal Aunt  Type 2   Hypertension Maternal Aunt    Breast cancer Maternal Aunt 60   Stomach cancer Maternal Grandmother 57    Social History   Socioeconomic History   Marital status: Single    Spouse name: Not on file   Number of children: 0   Years of education: Not on file   Highest education level: Not on file  Occupational History    Employer: [employer]  Tobacco Use   Smoking status: Never   Smokeless tobacco: Never  Vaping Use   Vaping Use: Never used  Substance and Sexual Activity   Alcohol use: Yes    Comment: soc   Drug use: No   Sexual activity: Not Currently    Birth control/protection: Injection  Other Topics Concern   Not on file  Social  History Narrative   Lives w/ mother   Social Determinants of Health   Financial Resource Strain: Not on file  Food Insecurity: Not on file  Transportation Needs: Not on file  Physical Activity: Not on file  Stress: Not on file  Social Connections: Not on file  Intimate Partner Violence: Not on file    Current Outpatient Medications on File Prior to Visit  Medication Sig Dispense Refill   [START ON 12/10/2022] amphetamine-dextroamphetamine (ADDERALL XR) 30 MG 24 hr capsule 1 capsule in the morning Orally Once a day (01/08/23) for 30 days     LORazepam (ATIVAN) 1 MG tablet Take 1 mg by mouth at bedtime as needed.     medroxyPROGESTERone Acetate 150 MG/ML SUSY INJECT 1 ML INTO THE MUSCLE ONCE FOR 1 DOSE. 1 mL 3   amphetamine-dextroamphetamine (ADDERALL XR) 20 MG 24 hr capsule 1 capsule in the morning Orally Once a day (11/10/22) for 30 days (Patient not taking: Reported on 11/14/2022)     [DISCONTINUED] azelastine (ASTELIN) 0.1 % nasal spray Place 2 sprays into both nostrils 2 (two) times daily. (Patient not taking: Reported on 10/31/2019) 30 mL 0   [DISCONTINUED] fluticasone (FLONASE) 50 MCG/ACT nasal spray Place 2 sprays into both nostrils daily. (Patient not taking: Reported on 10/31/2019) 1 g 0   No current facility-administered medications on file prior to visit.      ROS:  Review of Systems  Constitutional:  Negative for fatigue, fever and unexpected weight change.  Respiratory:  Negative for cough, shortness of breath and wheezing.   Cardiovascular:  Negative for chest pain, palpitations and leg swelling.  Gastrointestinal:  Negative for blood in stool, constipation, diarrhea, nausea and vomiting.  Endocrine: Negative for cold intolerance, heat intolerance and polyuria.  Genitourinary:  Positive for urgency and vaginal bleeding. Negative for dyspareunia, dysuria, flank pain, frequency, genital sores, hematuria, menstrual problem, pelvic pain, vaginal discharge and vaginal pain.   Musculoskeletal:  Negative for back pain, joint swelling and myalgias.  Skin:  Negative for rash.  Neurological:  Negative for dizziness, syncope, light-headedness, numbness and headaches.  Hematological:  Negative for adenopathy.  Psychiatric/Behavioral:  Negative for agitation, confusion, sleep disturbance and suicidal ideas. The patient is not nervous/anxious.      Objective: BP 102/70   Ht 5\' 4"  (1.626 m)   Wt 148 lb (67.1 kg)   BMI 25.40 kg/m    Physical Exam Constitutional:      Appearance: She is well-developed.  Genitourinary:     Vulva normal.     Right Labia: No rash, tenderness or lesions.    Left Labia: No tenderness, lesions or rash.    Vaginal bleeding present.  No vaginal discharge, erythema or tenderness.      Right Adnexa: not tender and no mass present.    Left Adnexa: not tender and no mass present.    No cervical friability or polyp.     Uterus is not enlarged or tender.  Breasts:    Right: No mass, nipple discharge, skin change or tenderness.     Left: No mass, nipple discharge, skin change or tenderness.  Neck:     Thyroid: No thyromegaly.  Cardiovascular:     Rate and Rhythm: Normal rate and regular rhythm.     Heart sounds: Normal heart sounds. No murmur heard. Pulmonary:     Effort: Pulmonary effort is normal.     Breath sounds: Normal breath sounds.  Abdominal:     Palpations: Abdomen is soft.     Tenderness: There is no abdominal tenderness. There is no guarding or rebound.  Musculoskeletal:        General: Normal range of motion.     Cervical back: Normal range of motion.  Lymphadenopathy:     Cervical: No cervical adenopathy.  Neurological:     General: No focal deficit present.     Mental Status: She is alert and oriented to person, place, and time.     Cranial Nerves: No cranial nerve deficit.  Skin:    General: Skin is warm and dry.  Psychiatric:        Mood and Affect: Mood normal.        Behavior: Behavior normal.         Thought Content: Thought content normal.        Judgment: Judgment normal.  Vitals reviewed.    Results: Results for orders placed or performed in visit on 11/14/22 (from the past 24 hour(s))  POCT Urinalysis Dipstick     Status: Abnormal   Collection Time: 11/14/22 11:04 AM  Result Value Ref Range   Color, UA yellow    Clarity, UA clear    Glucose, UA Negative Negative   Bilirubin, UA neg    Ketones, UA neg    Spec Grav, UA 1.010 1.010 - 1.025   Blood, UA trace    pH, UA 7.0 5.0 - 8.0   Protein, UA Negative Negative   Urobilinogen, UA     Nitrite, UA neg    Leukocytes, UA Negative Negative   Appearance     Odor     PT WITH MENSTRUAL BLEEDING   Assessment/Plan:  Encounter for annual routine gynecological examination  Screening for STD (sexually transmitted disease) - Plan: NuSwab Vaginitis Plus (VG+), HEP, RPR, HIV Panel, Hepatitis C antibody  Encounter for surveillance of injectable contraceptive--will check testosterone levels due to hirsutism. Pt then to decide if she wants to stop depo vs try different BC. 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 Takes daily  Urinary urgency - Plan: POCT Urinalysis Dipstick, Urine Culture; neg UA, check C&S. Cont water, void more frequently (don't hold urine), d/c liquids few hrs before bed.   Hirsutism - Plan: Testosterone,Free and Total; check labs, will f/u with results.    Meds ordered this encounter  Medications   valACYclovir (VALTREX) 500 MG tablet    Sig: Take 1 tablet (500 mg total) by mouth daily.    Dispense:  90 tablet    Refill:  3    Order Specific Question:   Supervising Provider    Answer:  CHERRY, ANIKA [[id number]]             GYN counsel adequate intake of calcium and vitamin D, diet and exercise     F/U  Return in about 1 year (around 11/14/2023).  Maxamillian Tienda B. Ezrael Sam, PA-C 11/14/2022 11:34 AM

## 2022-11-14 ENCOUNTER — Ambulatory Visit (INDEPENDENT_AMBULATORY_CARE_PROVIDER_SITE_OTHER): Payer: Managed Care, Other (non HMO) | Admitting: Obstetrics and Gynecology

## 2022-11-14 ENCOUNTER — Encounter: Payer: Self-pay | Admitting: Obstetrics and Gynecology

## 2022-11-14 VITALS — BP 102/70 | Ht 64.0 in | Wt 148.0 lb

## 2022-11-14 DIAGNOSIS — A6004 Herpesviral vulvovaginitis: Secondary | ICD-10-CM

## 2022-11-14 DIAGNOSIS — Z113 Encounter for screening for infections with a predominantly sexual mode of transmission: Secondary | ICD-10-CM

## 2022-11-14 DIAGNOSIS — R3915 Urgency of urination: Secondary | ICD-10-CM

## 2022-11-14 DIAGNOSIS — Z3042 Encounter for surveillance of injectable contraceptive: Secondary | ICD-10-CM

## 2022-11-14 DIAGNOSIS — Z01419 Encounter for gynecological examination (general) (routine) without abnormal findings: Secondary | ICD-10-CM

## 2022-11-14 DIAGNOSIS — Z803 Family history of malignant neoplasm of breast: Secondary | ICD-10-CM

## 2022-11-14 DIAGNOSIS — Z01411 Encounter for gynecological examination (general) (routine) with abnormal findings: Secondary | ICD-10-CM

## 2022-11-14 DIAGNOSIS — L680 Hirsutism: Secondary | ICD-10-CM

## 2022-11-14 LAB — POCT URINALYSIS DIPSTICK
Bilirubin, UA: NEGATIVE
Glucose, UA: NEGATIVE
Ketones, UA: NEGATIVE
Leukocytes, UA: NEGATIVE
Nitrite, UA: NEGATIVE
Protein, UA: NEGATIVE
Spec Grav, UA: 1.01 (ref 1.010–1.025)
pH, UA: 7 (ref 5.0–8.0)

## 2022-11-14 MED ORDER — VALACYCLOVIR HCL 500 MG PO TABS: 500.0000 mg | ORAL_TABLET | Freq: Every day | ORAL | 3 refills | Status: DC

## 2022-11-14 NOTE — Patient Instructions (Signed)
I value your feedback and you entrusting us with your care. If you get a [facility] patient survey, I would appreciate you taking the time to let us know about your experience today. Thank you! ? ? ?

## 2022-11-17 LAB — HEP, RPR, HIV PANEL
HIV Screen 4th Generation wRfx: NONREACTIVE
Hepatitis B Surface Ag: NEGATIVE
RPR Ser Ql: NONREACTIVE

## 2022-11-17 LAB — HEPATITIS C ANTIBODY: Hep C Virus Ab: NONREACTIVE

## 2022-11-17 LAB — TESTOSTERONE,FREE AND TOTAL
Testosterone, Free: 0.5 pg/mL (ref 0.0–4.2)
Testosterone: 8 ng/dL (ref 8–60)

## 2022-11-18 LAB — URINE CULTURE: Organism ID, Bacteria: NO GROWTH

## 2022-11-19 LAB — NUSWAB VAGINITIS PLUS (VG+)
Candida albicans, NAA: NEGATIVE
Candida glabrata, NAA: NEGATIVE
Chlamydia trachomatis, NAA: NEGATIVE
Neisseria gonorrhoeae, NAA: NEGATIVE
Trich vag by NAA: NEGATIVE

## 2022-11-20 ENCOUNTER — Encounter: Payer: Self-pay | Admitting: Obstetrics and Gynecology

## 2022-11-20 ENCOUNTER — Other Ambulatory Visit: Payer: Self-pay | Admitting: Obstetrics and Gynecology

## 2022-11-20 MED ORDER — ESTRADIOL 1 MG PO TABS: 1.0000 mg | ORAL_TABLET | Freq: Every day | ORAL | 0 refills | Status: DC

## 2022-11-20 NOTE — Progress Notes (Signed)
Rx estradiol for BTB with depo

## 2022-11-28 MED ORDER — MEDROXYPROGESTERONE ACETATE 150 MG/ML IM SUSY
150.0000 mg | PREFILLED_SYRINGE | INTRAMUSCULAR | Status: AC
  Administered 2023-07-25: 150 mg via INTRAMUSCULAR

## 2022-12-05 ENCOUNTER — Ambulatory Visit: Payer: Managed Care, Other (non HMO)

## 2022-12-05 VITALS — Wt 150.8 lb

## 2022-12-05 DIAGNOSIS — Z3042 Encounter for surveillance of injectable contraceptive: Secondary | ICD-10-CM

## 2022-12-05 MED ORDER — MEDROXYPROGESTERONE ACETATE 150 MG/ML IM SUSP
150.0000 mg | Freq: Once | INTRAMUSCULAR | Status: AC
  Administered 2022-12-05: 150 mg via INTRAMUSCULAR

## 2022-12-05 NOTE — Progress Notes (Signed)
    NURSE VISIT NOTE  Subjective:    Patient ID: Beth Peterson, female    DOB: 04-11-1991, 32 y.o.   MRN: [id number]  HPI  Patient is a 32 y.o. G0P0000 female who presents for depo provera injection.   Objective:    Wt 150 lb 12.8 oz (68.4 kg)   BMI 25.88 kg/m   Last Annual: 11/14/2022. Last pap: 11/12/2021. Last Depo-Provera: 09/17/2022. Side Effects if any: none. Serum HCG indicated? No . Depo-Provera 150 mg IM given by: Jennings Books, CMA. Site: Right Deltoid  Lab Review    Assessment:   1. Encounter for surveillance of injectable contraceptive      Plan:   Next appointment due between 6/13 and 6/27.    Minette Headland, CMA

## 2022-12-05 NOTE — Patient Instructions (Signed)

## 2023-02-04 ENCOUNTER — Ambulatory Visit: Payer: Managed Care, Other (non HMO) | Admitting: Obstetrics and Gynecology

## 2023-02-04 ENCOUNTER — Encounter: Payer: Self-pay | Admitting: Obstetrics and Gynecology

## 2023-02-04 VITALS — BP 118/80 | Ht 64.0 in | Wt 145.0 lb

## 2023-02-04 DIAGNOSIS — R3915 Urgency of urination: Secondary | ICD-10-CM

## 2023-02-04 DIAGNOSIS — Z113 Encounter for screening for infections with a predominantly sexual mode of transmission: Secondary | ICD-10-CM

## 2023-02-04 LAB — POCT URINALYSIS DIPSTICK
Bilirubin, UA: NEGATIVE
Blood, UA: NEGATIVE
Glucose, UA: NEGATIVE
Ketones, UA: NEGATIVE
Leukocytes, UA: NEGATIVE
Nitrite, UA: NEGATIVE
Protein, UA: NEGATIVE
Spec Grav, UA: 1.015
pH, UA: 6.5

## 2023-02-04 NOTE — Progress Notes (Signed)
Patient, No Pcp Per   Chief Complaint  Patient presents with   Urinary Tract Infection    Urgency, no burning x 3 days   STD testing    HPI:      [name] is a 32 y.o. G0P0000 whose LMP was No LMP recorded. Patient has had an injection., presents today for urinary urgency with and without good relief for 3 days; no dysuria, no LBP, pelvic pain, fevers. No vag sx. Had similar sx 3/24 with neg C&S. Pt drinks caffeine sometimes and sx may be related. Treats with cystex with sx relief. Drinking water daily. Took 1 leftover nitrofurantoin this AM.  Pt with hx of recurrent BV. Using boric acid supp 3 times wkly, taking probiotics. Sx seem better controlled.  Had unprotected sex a couple wks ago and wants STD testing. No new partners. 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 12/01/2019   Anxiety and depression 08/17/2019   Sleep deprivation 08/17/2019   Postcoital and contact bleeding 03/31/2018   Bacterial vaginosis 03/31/2018   ASCUS with positive high risk HPV cervical 02/26/2018   Family history of breast cancer 02/10/2018   NSAID long-term use 11/12/2012   Constipation 07/07/2012   Urinary frequency 07/07/2012   Chest pain 05/08/2012   GERD (gastroesophageal reflux disease) 05/08/2012    Past Surgical History:  Procedure Laterality Date   APPENDECTOMY  2012   [facility]   ESOPHAGOGASTRODUODENOSCOPY  04/02/12   Fields-proximal esophageal web, small hiatal hernia, mild non-H. pylori gastritis, 12.8-16mm savory dilation   ESOPHAGOGASTRODUODENOSCOPY (EGD) WITH PROPOFOL N/A 08/07/2015   Procedure: ESOPHAGOGASTRODUODENOSCOPY (EGD) WITH PROPOFOL;  Surgeon: Christena Deem, MD;  Location: [facility] ENDOSCOPY;  Service: Endoscopy;  Laterality: N/A;   INTRAUTERINE DEVICE (IUD) INSERTION     ovarian cyst removed     uterus tacked  2012    Family History  Problem Relation Age of  Onset   Hypertension Mother    Pancreatic cancer Maternal Aunt        around 67   Diabetes Maternal Aunt        Type 2   Hypertension Maternal Aunt    Breast cancer Maternal Aunt 85   Stomach cancer Maternal Grandmother 40    Social History   Socioeconomic History   Marital status: Single    Spouse name: Not on file   Number of children: 0   Years of education: Not on file   Highest education level: Not on file  Occupational History    Employer: [employer]  Tobacco Use   Smoking status: Never   Smokeless tobacco: Never  Vaping Use   Vaping Use: Never used  Substance and Sexual Activity   Alcohol use: Yes    Comment: soc   Drug use: No   Sexual activity: Yes    Birth control/protection: Injection  Other Topics Concern   Not on file  Social History Narrative   Lives w/ mother   Social Determinants of Health   Financial Resource Strain: Not on file  Food Insecurity: Not on file  Transportation Needs: Not on file  Physical Activity: Not on file  Stress: Not on file  Social Connections: Not on file  Intimate Partner Violence: Not on file    Outpatient Medications Prior to Visit  Medication Sig Dispense Refill   amphetamine-dextroamphetamine (ADDERALL XR) 15 MG 24 hr capsule Take by mouth  daily.     amphetamine-dextroamphetamine (ADDERALL XR) 30 MG 24 hr capsule 1 capsule in the morning Orally Once a day (01/08/23) for 30 days     LORazepam (ATIVAN) 1 MG tablet Take 1 mg by mouth at bedtime as needed.     valACYclovir (VALTREX) 500 MG tablet Take 1 tablet (500 mg total) by mouth daily. 90 tablet 3   amphetamine-dextroamphetamine (ADDERALL XR) 20 MG 24 hr capsule 1 capsule in the morning Orally Once a day (11/10/22) for 30 days (Patient not taking: Reported on 11/14/2022)     estradiol (ESTRACE) 1 MG tablet Take 1 tablet (1 mg total) by mouth daily for 14 days. 14 tablet 0   medroxyPROGESTERone Acetate 150 MG/ML SUSY INJECT 1 ML INTO THE MUSCLE ONCE FOR 1 DOSE. 1 mL 3    Facility-Administered Medications Prior to Visit  Medication Dose Route Frequency Provider Last Rate Last Admin   medroxyPROGESTERone Acetate SUSY 150 mg  150 mg Intramuscular Q90 days Carmel Waddington B, PA-C          ROS:  Review of Systems  Constitutional:  Negative for fever.  Gastrointestinal:  Negative for blood in stool, constipation, diarrhea, nausea and vomiting.  Genitourinary:  Positive for frequency and urgency. Negative for dyspareunia, dysuria, flank pain, hematuria, vaginal bleeding, vaginal discharge and vaginal pain.  Musculoskeletal:  Negative for back pain.  Skin:  Negative for rash.   BREAST: No symptoms   OBJECTIVE:   Vitals:  BP 118/80   Ht 5\' 4"  (1.626 m)   Wt 145 lb (65.8 kg)   BMI 24.89 kg/m   Physical Exam Vitals reviewed.  Constitutional:      Appearance: She is well-developed.  Pulmonary:     Effort: Pulmonary effort is normal.  Genitourinary:    General: Normal vulva.     Pubic Area: No rash.      Labia:        Right: No rash, tenderness or lesion.        Left: No rash, tenderness or lesion.      Vagina: Normal. No vaginal discharge, erythema or tenderness.     Cervix: Normal.     Uterus: Normal. Not enlarged and not tender.      Adnexa: Right adnexa normal and left adnexa normal.       Right: No mass or tenderness.         Left: No mass or tenderness.    Musculoskeletal:        General: Normal range of motion.     Cervical back: Normal range of motion.  Skin:    General: Skin is warm and dry.  Neurological:     General: No focal deficit present.     Mental Status: She is alert and oriented to person, place, and time.  Psychiatric:        Mood and Affect: Mood normal.        Behavior: Behavior normal.        Thought Content: Thought content normal.        Judgment: Judgment normal.     Results: Results for orders placed or performed in visit on 02/04/23 (from the past 24 hour(s))  POCT urinalysis dipstick     Status:  Normal   Collection Time: 02/04/23 12:23 PM  Result Value Ref Range   Color, UA yellow    Clarity, UA clear    Glucose, UA Negative Negative   Bilirubin, UA neg    Ketones, UA neg  Spec Grav, UA 1.015 1.010 - 1.025   Blood, UA neg    pH, UA 6.5 5.0 - 8.0   Protein, UA Negative Negative   Urobilinogen, UA     Nitrite, UA neg    Leukocytes, UA Negative Negative   Appearance     Odor       Assessment/Plan: Urinary urgency - Plan: Urine Culture, POCT urinalysis dipstick; pos sx and neg UA. Check C&S. Will f/u if abn. D/c caffeine in meantime. May be OAB if sx persist.   Screening for STD (sexually transmitted disease) - Plan: NuSwab Vaginitis Plus (VG+)    Return if symptoms worsen or fail to improve.  Jakelin Taussig B. Takasha Vetere, PA-C 02/04/2023 12:25 PM

## 2023-02-06 ENCOUNTER — Encounter: Payer: Self-pay | Admitting: Obstetrics and Gynecology

## 2023-02-06 LAB — NUSWAB VAGINITIS PLUS (VG+)
Candida albicans, NAA: NEGATIVE
Candida glabrata, NAA: NEGATIVE
Chlamydia trachomatis, NAA: NEGATIVE
Neisseria gonorrhoeae, NAA: NEGATIVE
Trich vag by NAA: NEGATIVE

## 2023-02-06 LAB — URINE CULTURE: Organism ID, Bacteria: NO GROWTH

## 2023-02-10 ENCOUNTER — Encounter: Payer: Self-pay | Admitting: Obstetrics and Gynecology

## 2023-02-12 ENCOUNTER — Telehealth: Payer: Self-pay

## 2023-02-12 NOTE — Telephone Encounter (Signed)
Pt calling to see if we can print out her records for her to take to her new OBGYN as they won't schedule an appt until they have them.  Adv to fill out a ROI b/c we can't print anything without the ROI filled out first; then it will be 1-2 weeks before we can get them to her.  Pt states she will be here after 10am to fill out ROI.

## 2023-02-20 ENCOUNTER — Ambulatory Visit: Payer: Managed Care, Other (non HMO)

## 2023-02-20 VITALS — BP 138/90 | HR 80 | Wt 147.0 lb

## 2023-02-20 DIAGNOSIS — Z3042 Encounter for surveillance of injectable contraceptive: Secondary | ICD-10-CM

## 2023-02-20 MED ORDER — MEDROXYPROGESTERONE ACETATE 150 MG/ML IM SUSY
150.0000 mg | PREFILLED_SYRINGE | Freq: Once | INTRAMUSCULAR | Status: AC
  Administered 2023-02-20: 150 mg via INTRAMUSCULAR

## 2023-02-20 NOTE — Progress Notes (Addendum)
    NURSE VISIT NOTE  Subjective:    Patient ID: Beth Peterson, female    DOB: 06/04/1991, 32 y.o.   MRN: [id number]  HPI  Patient is a 32 y.o. G0P0000 female who presents for depo provera injection.   Objective:    There were no vitals taken for this visit.  Last Annual: 11/14/2022. Last pap:.11/13/2022 Last Depo-Provera: 12/05/2022. Side Effects if any: none Serum HCG indicated? No . Depo-Provera 150 mg IM given by: Doristine Devoid, CMA. Site: Left Deltoid    Assessment:   No diagnosis found.   Plan:   Next appointment due between August 29th and September 12th.    Burtis Junes, CMA

## 2023-02-27 ENCOUNTER — Other Ambulatory Visit: Payer: Self-pay | Admitting: Nurse Practitioner

## 2023-03-03 LAB — CYTOLOGY - PAP
Comment: NEGATIVE
Diagnosis: NEGATIVE
High risk HPV: NEGATIVE

## 2023-05-08 ENCOUNTER — Ambulatory Visit: Payer: Managed Care, Other (non HMO)

## 2023-05-08 NOTE — Progress Notes (Signed)
    NURSE VISIT NOTE  Subjective:    Patient ID: Beth Peterson, female    DOB: Jan 16, 1991, 32 y.o.   MRN: [id number]  HPI  Patient is a 32 y.o. G0P0000 female who presents for depo provera injection.   Objective:    BP 112/81   Pulse 79   Ht 5\' 4"  (1.626 m)   Wt 146 lb (66.2 kg)   BMI 25.06 kg/m   Last Annual: 11/14/2022. Marland Kitchen Last pap: 02/27/2023. Last Depo-Provera: 02/20/2023 . Side Effects if any: none. Serum HCG indicated? No . Depo-Provera 150 mg IM given by: Beverely Pace, CMA. Site: Right Deltoid   Assessment:   1. Encounter for Depo-Provera contraception      Plan:   Next appointment due between nov 15 and 29.    Loney Laurence, CMA d

## 2023-05-09 ENCOUNTER — Ambulatory Visit (INDEPENDENT_AMBULATORY_CARE_PROVIDER_SITE_OTHER): Payer: Managed Care, Other (non HMO)

## 2023-05-09 VITALS — BP 112/81 | HR 79 | Ht 64.0 in | Wt 146.0 lb

## 2023-05-09 DIAGNOSIS — Z3042 Encounter for surveillance of injectable contraceptive: Secondary | ICD-10-CM | POA: Diagnosis not present

## 2023-05-09 MED ORDER — MEDROXYPROGESTERONE ACETATE 150 MG/ML IM SUSP: 150.0000 mg | INTRAMUSCULAR | 0 refills | Status: DC

## 2023-05-09 MED ORDER — MEDROXYPROGESTERONE ACETATE 150 MG/ML IM SUSY
150.0000 mg | PREFILLED_SYRINGE | Freq: Once | INTRAMUSCULAR | Status: AC
  Administered 2023-05-09: 150 mg via INTRAMUSCULAR

## 2023-07-24 NOTE — Patient Instructions (Signed)

## 2023-07-24 NOTE — Progress Notes (Signed)
    NURSE VISIT NOTE  Subjective:    Patient ID: Beth Peterson, female    DOB: [birth year], 32 y.o.   MRN: [id number]  HPI  Patient is a 32 y.o. G0P0000 female who presents for depo provera injection. Patient had her depo done today. She would like to switch her contraception method to OCP. She sent you a message in regards to this today.   Objective:    BP 110/68   Ht 5\' 4"  (1.626 m)   Wt 153 lb 1.6 oz (69.4 kg)   BMI 26.28 kg/m   Last Annual: 11/24/2022. Last pap: 02/27/2023. Last Depo-Provera: 05/09/2023. Side Effects if any: none. Serum HCG indicated? No . Depo-Provera 150 mg IM given by: Santiago Bumpers, CMA. Site: Left Deltoid  Lab Review  None  Assessment:   1. Encounter for surveillance of injectable contraceptive      Plan:   Next appointment due between Jan. 31 and Feb. 14.    Santiago Bumpers, CMA [facility]

## 2023-07-25 ENCOUNTER — Encounter: Payer: Self-pay | Admitting: Obstetrics and Gynecology

## 2023-07-25 ENCOUNTER — Ambulatory Visit (INDEPENDENT_AMBULATORY_CARE_PROVIDER_SITE_OTHER): Payer: Managed Care, Other (non HMO)

## 2023-07-25 VITALS — BP 110/68 | Ht 64.0 in | Wt 153.1 lb

## 2023-07-25 DIAGNOSIS — Z3042 Encounter for surveillance of injectable contraceptive: Secondary | ICD-10-CM | POA: Diagnosis not present

## 2023-07-28 MED ORDER — DROSPIRENONE-ETHINYL ESTRADIOL 3-0.02 MG PO TABS: 1.0000 | ORAL_TABLET | Freq: Every day | ORAL | 0 refills | Status: DC

## 2023-07-28 NOTE — Telephone Encounter (Signed)
Rx yaz for BC/acne. Start 10/19/23

## 2023-09-15 ENCOUNTER — Encounter: Payer: Self-pay | Admitting: Obstetrics and Gynecology

## 2023-09-16 MED ORDER — CLINDAMYCIN HCL 300 MG PO CAPS: 300.0000 mg | ORAL_CAPSULE | Freq: Two times a day (BID) | ORAL | 0 refills | Status: AC

## 2023-10-10 ENCOUNTER — Ambulatory Visit: Payer: Managed Care, Other (non HMO)

## 2023-11-08 DIAGNOSIS — Z803 Family history of malignant neoplasm of breast: Secondary | ICD-10-CM

## 2023-11-08 DIAGNOSIS — Z1371 Encounter for nonprocreative screening for genetic disease carrier status: Secondary | ICD-10-CM

## 2023-11-08 DIAGNOSIS — Z1589 Genetic susceptibility to other disease: Secondary | ICD-10-CM

## 2023-11-08 HISTORY — DX: Encounter for nonprocreative screening for genetic disease carrier status: Z13.71

## 2023-11-08 HISTORY — DX: Genetic susceptibility to other disease: Z15.89

## 2023-11-08 HISTORY — DX: Family history of malignant neoplasm of breast: Z80.3

## 2023-11-15 ENCOUNTER — Encounter: Payer: Self-pay | Admitting: Obstetrics and Gynecology

## 2023-11-17 ENCOUNTER — Other Ambulatory Visit: Payer: Self-pay | Admitting: Obstetrics and Gynecology

## 2023-11-17 DIAGNOSIS — A6004 Herpesviral vulvovaginitis: Secondary | ICD-10-CM

## 2023-11-17 MED ORDER — VALACYCLOVIR HCL 500 MG PO TABS: 500.0000 mg | ORAL_TABLET | Freq: Every day | ORAL | 0 refills | Status: DC

## 2023-11-17 NOTE — Progress Notes (Signed)
 Rx RF valtrex till 3/25 annual

## 2023-11-26 NOTE — Progress Notes (Unsigned)
 No chief complaint on file.    HPI:      Beth Peterson is a 32 y.o. G0P0000 who LMP was No LMP recorded. Patient has had an injection., presents today for her annual examination. Her menses are absent with depo. Dysmenorrhea occas, does have occas BTB when close to next depo inj/with BV sx. Pt complaining of increased acne/facial hair. No hx of PCOS. May want to stop depo vs change BC.    Sex activity: occasionally sexually active; contraception - depo. Had recurrent BV with IUD, improved with depo but sex is still trigger. Not doing condoms. Does metrogel RF prn. Taking probiotics, occas does boric acid supp. Had sx a few wks ago and treated with flagyl. No sx today. Wants full STD testing. 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 Needs Rx RF.    There is a FH of breast cancer in her mat aunt, genetic testing not done and pancreatic cancer in her other mat aunt. There is no FH of ovarian cancer. The patient does not do self-breast exams.   Tobacco use: The patient denies current or previous tobacco use. Alcohol use: social drinker No drug use.  Exercise: mod active   She does get adequate calcium and Vitamin D in her diet.  She had BV and AV 11/18 on One Swab culture, treated with cleocin and avelox with sx relief. Hx of recurrent BV for the past couple of yrs. Sx persisted even with metrogel twice wkly as preventive (although pt didn't always use correctly). Had failed boric acid supp, probiotic use, condoms and then removed IUD and sx much improved/resolved.   Still has intermittent urinary urgency with little flow sometimes, no caffeine use. Drinks ~64 oz water daily. No other UTI sx. Has started voiding more frequently and not holding urine and sx a little improved. Has nocturia as well.  Works 3rd shift. Saw urology in past with neg testing.   Past Medical History:  Diagnosis Date   Abnormal Pap smear of cervix 2016   ASCUS with positive high risk HPV cervical 02/2018   BV (bacterial vaginosis)    Chest pain    Constipation    Dysphagia    Eosinophilic gastroenteritis    GERD (gastroesophageal reflux disease)    History of hiatal hernia    Ovarian cyst    UTI (urinary tract infection)     Past Surgical History:  Procedure Laterality Date   APPENDECTOMY  2012   [facility]   ESOPHAGOGASTRODUODENOSCOPY  04/02/12   Fields-proximal esophageal web, small hiatal hernia, mild non-H. pylori gastritis, 12.8-16mm savory dilation   ESOPHAGOGASTRODUODENOSCOPY (EGD) WITH PROPOFOL N/A 08/07/2015   Procedure: ESOPHAGOGASTRODUODENOSCOPY (EGD) WITH PROPOFOL;  Surgeon: Christena Deem, MD;  Location[facility] END[facility]COPY;  Service: Endoscopy;  Laterality: N/A;   INTRAUTERINE DEVICE (IUD) INSERTION     ovarian cyst removed     uterus tacked  2012    Family History  Problem Relation Age of Onset   Hypertension Mother    Pancreatic cancer Maternal Aunt        around 77   Diabetes Maternal Aunt        Type 2   Hypertension Maternal Aunt    Breast cancer Maternal Aunt 60   Stomach cancer Maternal Grandmother 70  Social History   Socioeconomic History   Marital status: Single    Spouse name: Not on file   Number of children: 0   Years of education: Not on file   Highest education level: Not on file  Occupational History    Employer: [employer]  Tobacco Use   Smoking status: Never   Smokeless tobacco: Never  Vaping Use   Vaping status: Never Used  Substance and Sexual Activity   Alcohol use: Yes    Comment: soc   Drug use: No   Sexual activity: Yes    Birth control/protection: Injection  Other Topics Concern   Not on file  Social History Narrative   Lives w/ mother   Social Drivers of Health   Financial Resource Strain: Not on file  Food Insecurity: Not  on file  Transportation Needs: Not on file  Physical Activity: Not on file  Stress: Not on file  Social Connections: Not on file  Intimate Partner Violence: Not on file    Current Outpatient Medications on File Prior to Visit  Medication Sig Dispense Refill   amphetamine-dextroamphetamine (ADDERALL XR) 20 MG 24 hr capsule Take 20 mg by mouth daily.     amphetamine-dextroamphetamine (ADDERALL XR) 30 MG 24 hr capsule 1 capsule in the morning Orally Once a day (01/08/23) for 30 days     drospirenone-ethinyl estradiol (YAZ) 3-0.02 MG tablet Take 1 tablet by mouth daily. 84 tablet 0   LORazepam (ATIVAN) 1 MG tablet Take 1 mg by mouth at bedtime as needed.     valACYclovir (VALTREX) 500 MG tablet Take 1 tablet (500 mg total) by mouth daily. 90 tablet 0   [DISCONTINUED] azelastine (ASTELIN) 0.1 % nasal spray Place 2 sprays into both nostrils 2 (two) times daily. (Patient not taking: Reported on 10/31/2019) 30 mL 0   [DISCONTINUED] fluticasone (FLONASE) 50 MCG/ACT nasal spray Place 2 sprays into both nostrils daily. (Patient not taking: Reported on 10/31/2019) 1 g 0   Current Facility-Administered Medications on File Prior to Visit  Medication Dose Route Frequency Provider Last Rate Last Admin   medroxyPROGESTERone Acetate SUSY 150 mg  150 mg Intramuscular Q90 days Orie Cuttino B, PA-C   150 mg at 07/25/23 1015      ROS:  Review of Systems  Constitutional:  Negative for fatigue, fever and unexpected weight change.  Respiratory:  Negative for cough, shortness of breath and wheezing.   Cardiovascular:  Negative for chest pain, palpitations and leg swelling.  Gastrointestinal:  Negative for blood in stool, constipation, diarrhea, nausea and vomiting.  Endocrine: Negative for cold intolerance, heat intolerance and polyuria.  Genitourinary:  Positive for urgency and vaginal bleeding. Negative for dyspareunia, dysuria, flank pain, frequency, genital sores, hematuria, menstrual problem, pelvic pain,  vaginal discharge and vaginal pain.  Musculoskeletal:  Negative for back pain, joint swelling and myalgias.  Skin:  Negative for rash.  Neurological:  Negative for dizziness, syncope, light-headedness, numbness and headaches.  Hematological:  Negative for adenopathy.  Psychiatric/Behavioral:  Negative for agitation, confusion, sleep disturbance and suicidal ideas. The patient is not nervous/anxious.      Objective: There were no vitals taken for this visit.   Physical Exam Constitutional:      Appearance: She is well-developed.  Genitourinary:     Vulva normal.     Right Labia: No rash, tenderness or lesions.    Left Labia: No tenderness, lesions or rash.    Vaginal bleeding present.     No vaginal discharge, erythema  or tenderness.      Right Adnexa: not tender and no mass present.    Left Adnexa: not tender and no mass present.    No cervical friability or polyp.     Uterus is not enlarged or tender.  Breasts:    Right: No mass, nipple discharge, skin change or tenderness.     Left: No mass, nipple discharge, skin change or tenderness.  Neck:     Thyroid: No thyromegaly.  Cardiovascular:     Rate and Rhythm: Normal rate and regular rhythm.     Heart sounds: Normal heart sounds. No murmur heard. Pulmonary:     Effort: Pulmonary effort is normal.     Breath sounds: Normal breath sounds.  Abdominal:     Palpations: Abdomen is soft.     Tenderness: There is no abdominal tenderness. There is no guarding or rebound.  Musculoskeletal:        General: Normal range of motion.     Cervical back: Normal range of motion.  Lymphadenopathy:     Cervical: No cervical adenopathy.  Neurological:     General: No focal deficit present.     Mental Status: She is alert and oriented to person, place, and time.     Cranial Nerves: No cranial nerve deficit.  Skin:    General: Skin is warm and dry.  Psychiatric:        Mood and Affect: Mood normal.        Behavior: Behavior normal.         Thought Content: Thought content normal.        Judgment: Judgment normal.  Vitals reviewed.    Results: No results found for this or any previous visit (from the past 24 hours).  PT WITH MENSTRUAL BLEEDING   Assessment/Plan:  Encounter for annual routine gynecological examination  Screening for STD (sexually transmitted disease) - Plan: NuSwab Vaginitis Plus (VG+), HEP, RPR, HIV Panel, Hepatitis C antibody  Encounter for surveillance of injectable contraceptive--will check testosterone levels due to hirsutism. Pt then to decide if she wants to stop depo vs try different BC. 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 Takes daily  Urinary urgency - Plan: POCT Urinalysis Dipstick, Urine Culture; neg UA, check C&S. Cont water, void more frequently (don't hold urine), d/c liquids few hrs before bed.   Hirsutism - Plan: Testosterone,Free and Total; check labs, will f/u with results.    No orders of the defined types were placed in this encounter.            GYN counsel adequate intake of calcium and vitamin D, diet and exercise     F/U  No follow-ups on file.  Jalaiyah Throgmorton B. Benjamin Casanas, PA-C 11/26/2023 8:28 PM

## 2023-11-27 ENCOUNTER — Encounter: Payer: Self-pay | Admitting: Obstetrics and Gynecology

## 2023-11-27 ENCOUNTER — Ambulatory Visit (INDEPENDENT_AMBULATORY_CARE_PROVIDER_SITE_OTHER): Payer: Managed Care, Other (non HMO) | Admitting: Obstetrics and Gynecology

## 2023-11-27 VITALS — BP 96/70 | Ht 64.0 in | Wt 155.0 lb

## 2023-11-27 DIAGNOSIS — Z01419 Encounter for gynecological examination (general) (routine) without abnormal findings: Secondary | ICD-10-CM | POA: Diagnosis not present

## 2023-11-27 DIAGNOSIS — Z803 Family history of malignant neoplasm of breast: Secondary | ICD-10-CM

## 2023-11-27 DIAGNOSIS — R5382 Chronic fatigue, unspecified: Secondary | ICD-10-CM

## 2023-11-27 DIAGNOSIS — Z0000 Encounter for general adult medical examination without abnormal findings: Secondary | ICD-10-CM

## 2023-11-27 DIAGNOSIS — Z3041 Encounter for surveillance of contraceptive pills: Secondary | ICD-10-CM

## 2023-11-27 DIAGNOSIS — A6004 Herpesviral vulvovaginitis: Secondary | ICD-10-CM

## 2023-11-27 DIAGNOSIS — B9689 Other specified bacterial agents as the cause of diseases classified elsewhere: Secondary | ICD-10-CM

## 2023-11-27 DIAGNOSIS — Z3042 Encounter for surveillance of injectable contraceptive: Secondary | ICD-10-CM

## 2023-11-27 DIAGNOSIS — Z113 Encounter for screening for infections with a predominantly sexual mode of transmission: Secondary | ICD-10-CM

## 2023-11-27 DIAGNOSIS — Z800 Family history of malignant neoplasm of digestive organs: Secondary | ICD-10-CM

## 2023-11-27 MED ORDER — METRONIDAZOLE 0.75 % VA GEL: 1.0000 | Freq: Every day | VAGINAL | 2 refills | Status: AC

## 2023-11-27 MED ORDER — DROSPIRENONE-ETHINYL ESTRADIOL 3-0.02 MG PO TABS: 1.0000 | ORAL_TABLET | Freq: Every day | ORAL | 3 refills | Status: DC

## 2023-11-27 MED ORDER — VALACYCLOVIR HCL 500 MG PO TABS: 500.0000 mg | ORAL_TABLET | Freq: Every day | ORAL | 3 refills | Status: DC

## 2023-11-27 NOTE — Patient Instructions (Signed)
 I value your feedback and you entrusting [place] with your care. If you get a [place] patient survey, I would appreciate you taking the time to let us know about your experience today. Thank you! ? ? ?

## 2023-11-29 ENCOUNTER — Encounter: Payer: Self-pay | Admitting: Obstetrics and Gynecology

## 2023-11-29 LAB — T4, FREE: Free T4: 1.01 ng/dL (ref 0.82–1.77)

## 2023-11-29 LAB — CBC WITH DIFFERENTIAL/PLATELET
Basophils Absolute: 0.1 10*3/uL (ref 0.0–0.2)
Basos: 1 %
EOS (ABSOLUTE): 0.1 10*3/uL (ref 0.0–0.4)
Eos: 1 %
Hematocrit: 41.8 % (ref 34.0–46.6)
Hemoglobin: 13.9 g/dL (ref 11.1–15.9)
Immature Grans (Abs): 0 10*3/uL (ref 0.0–0.1)
Immature Granulocytes: 0 %
Lymphocytes Absolute: 2.8 10*3/uL (ref 0.7–3.1)
Lymphs: 52 %
MCH: 32.1 pg (ref 26.6–33.0)
MCHC: 33.3 g/dL (ref 31.5–35.7)
MCV: 97 fL (ref 79–97)
Monocytes Absolute: 0.4 10*3/uL (ref 0.1–0.9)
Monocytes: 8 %
Neutrophils Absolute: 2.1 10*3/uL (ref 1.4–7.0)
Neutrophils: 38 %
Platelets: 217 10*3/uL (ref 150–450)
RBC: 4.33 x10E6/uL (ref 3.77–5.28)
RDW: 12.2 % (ref 11.7–15.4)
WBC: 5.4 10*3/uL (ref 3.4–10.8)

## 2023-11-29 LAB — VITAMIN D 25 HYDROXY (VIT D DEFICIENCY, FRACTURES): Vit D, 25-Hydroxy: 39 ng/mL (ref 30.0–100.0)

## 2023-11-29 LAB — HIV ANTIBODY (ROUTINE TESTING W REFLEX): HIV Screen 4th Generation wRfx: NONREACTIVE

## 2023-11-29 LAB — B12 AND FOLATE PANEL
Folate: 10.5 ng/mL (ref >3.0)
Vitamin B-12: 672 pg/mL (ref 232–1245)

## 2023-11-29 LAB — TSH: TSH: 1.36 u[IU]/mL (ref 0.450–4.500)

## 2023-11-29 LAB — HEPATITIS C ANTIBODY: Hep C Virus Ab: NONREACTIVE

## 2023-11-29 LAB — RPR: RPR Ser Ql: NONREACTIVE

## 2023-11-30 LAB — NUSWAB VAGINITIS PLUS (VG+)
Candida albicans, NAA: POSITIVE — AB
Candida glabrata, NAA: NEGATIVE
Chlamydia trachomatis, NAA: NEGATIVE
Neisseria gonorrhoeae, NAA: NEGATIVE
Trich vag by NAA: NEGATIVE

## 2023-12-01 ENCOUNTER — Other Ambulatory Visit: Payer: Self-pay | Admitting: Obstetrics and Gynecology

## 2023-12-01 ENCOUNTER — Encounter: Payer: Self-pay | Admitting: Obstetrics and Gynecology

## 2023-12-01 MED ORDER — FLUCONAZOLE 150 MG PO TABS: 150.0000 mg | ORAL_TABLET | Freq: Once | ORAL | 0 refills | Status: AC

## 2023-12-01 NOTE — Progress Notes (Signed)
Rx diflucan for yeast on culture

## 2023-12-11 ENCOUNTER — Encounter: Payer: Self-pay | Admitting: Obstetrics and Gynecology

## 2023-12-18 ENCOUNTER — Telehealth: Payer: Self-pay | Admitting: Obstetrics and Gynecology

## 2023-12-18 ENCOUNTER — Encounter: Payer: Self-pay | Admitting: Obstetrics and Gynecology

## 2023-12-18 DIAGNOSIS — Z1231 Encounter for screening mammogram for malignant neoplasm of breast: Secondary | ICD-10-CM

## 2023-12-18 DIAGNOSIS — Z803 Family history of malignant neoplasm of breast: Secondary | ICD-10-CM

## 2023-12-18 NOTE — Telephone Encounter (Signed)
 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 IBIS=19.4%/riskscore=19.9%. Technically no increased screening recommendations but since pt is right on cusp of 20% risk, will start mammos now, per pt pref. No scr MRI needed. As for MUTYH, no increased colon cancer screening recommendations currently; FOB should do prenatal genetic testing if wants pregnancy.   Patient understands these results only apply to her and her children, and this is not indicative of genetic testing results of her other family members. It is recommended that her other family members have genetic testing done.  Pt also understands negative genetic testing doesn't mean she will never get any of these cancers.   Hard copy mailed to pt. F/u prn.

## 2024-01-06 ENCOUNTER — Ambulatory Visit
Admission: RE | Admit: 2024-01-06 | Discharge: 2024-01-06 | Disposition: A | Discharge status: Home / Self Care | Source: Ambulatory Visit | Attending: Obstetrics and Gynecology | Admitting: Obstetrics and Gynecology

## 2024-01-06 DIAGNOSIS — Z1231 Encounter for screening mammogram for malignant neoplasm of breast: Secondary | ICD-10-CM | POA: Insufficient documentation

## 2024-01-06 DIAGNOSIS — Z803 Family history of malignant neoplasm of breast: Secondary | ICD-10-CM | POA: Diagnosis present

## 2024-01-08 ENCOUNTER — Encounter: Payer: Self-pay | Admitting: Obstetrics and Gynecology

## 2024-01-08 ENCOUNTER — Other Ambulatory Visit: Payer: Self-pay | Admitting: Obstetrics and Gynecology

## 2024-01-08 DIAGNOSIS — R928 Other abnormal and inconclusive findings on diagnostic imaging of breast: Secondary | ICD-10-CM

## 2024-01-12 ENCOUNTER — Ambulatory Visit
Admission: RE | Admit: 2024-01-12 | Discharge: 2024-01-12 | Disposition: A | Discharge status: Home / Self Care | Source: Ambulatory Visit | Attending: Obstetrics and Gynecology | Admitting: Obstetrics and Gynecology

## 2024-01-12 DIAGNOSIS — R928 Other abnormal and inconclusive findings on diagnostic imaging of breast: Secondary | ICD-10-CM

## 2024-01-13 ENCOUNTER — Encounter: Payer: Self-pay | Admitting: Obstetrics and Gynecology

## 2024-03-15 ENCOUNTER — Encounter: Payer: Self-pay | Admitting: Obstetrics and Gynecology

## 2024-04-29 ENCOUNTER — Ambulatory Visit: Admitting: Family Medicine

## 2024-04-29 VITALS — BP 120/84 | Temp 98.4°F | Ht 64.0 in | Wt 151.6 lb

## 2024-04-29 DIAGNOSIS — K219 Gastro-esophageal reflux disease without esophagitis: Secondary | ICD-10-CM

## 2024-04-29 DIAGNOSIS — Z79899 Other long term (current) drug therapy: Secondary | ICD-10-CM

## 2024-04-29 DIAGNOSIS — E559 Vitamin D deficiency, unspecified: Secondary | ICD-10-CM

## 2024-04-29 DIAGNOSIS — Z8742 Personal history of other diseases of the female genital tract: Secondary | ICD-10-CM

## 2024-04-29 DIAGNOSIS — F330 Major depressive disorder, recurrent, mild: Secondary | ICD-10-CM

## 2024-04-29 DIAGNOSIS — F900 Attention-deficit hyperactivity disorder, predominantly inattentive type: Secondary | ICD-10-CM | POA: Diagnosis not present

## 2024-04-29 DIAGNOSIS — L700 Acne vulgaris: Secondary | ICD-10-CM | POA: Diagnosis not present

## 2024-04-29 DIAGNOSIS — A6004 Herpesviral vulvovaginitis: Secondary | ICD-10-CM

## 2024-04-29 DIAGNOSIS — F411 Generalized anxiety disorder: Secondary | ICD-10-CM

## 2024-04-29 DIAGNOSIS — Z136 Encounter for screening for cardiovascular disorders: Secondary | ICD-10-CM

## 2024-04-29 MED ORDER — AMPHETAMINE-DEXTROAMPHET ER 30 MG PO CP24
30.0000 mg | ORAL_CAPSULE | Freq: Every day | ORAL | 0 refills | Status: DC
Start: 2024-04-29 — End: 2024-06-01

## 2024-04-29 MED ORDER — LORAZEPAM 1 MG PO TABS: 1.0000 mg | ORAL_TABLET | Freq: Every evening | ORAL | 3 refills | Status: AC | PRN

## 2024-04-29 MED ORDER — AMPHETAMINE-DEXTROAMPHET ER 20 MG PO CP24: 20.0000 mg | ORAL_CAPSULE | Freq: Every day | ORAL | 0 refills | Status: DC

## 2024-04-29 MED ORDER — CLINDAMYCIN PHOS-BENZOYL PEROX 1-5 % EX GEL: Freq: Two times a day (BID) | CUTANEOUS | 3 refills | Status: AC

## 2024-04-29 NOTE — Progress Notes (Signed)
 New Patient Visit  Subjective:     Patient ID: Beth Peterson, female    DOB: [birth year], 33 y.o.   MRN: [id number]  Chief Complaint  Patient presents with   Establish Care    HPI  Discussed the use of AI scribe software for clinical note transcription with the patient, who gave verbal consent to proceed.  History of Present Illness Beth Peterson is a 33 year old female who presents with concerns about menstrual irregularities and acne. She is a previous patient of mine from [facility].  Menstrual irregularities - Amenorrhea since February or March after discontinuing Depo-Provera  in January and starting birth control pills - Pelvic ultrasound in July showed no abnormalities - History of ovarian cysts, but none recently  Cutaneous symptoms - Persistent acne and chin hair - Previous treatments with doxycycline , spironolactone, and tretinoin were ineffective or caused adverse effects  Energy and metabolic concerns - Low energy levels - Daily vitamin D  supplementation for previously low levels - Regular exercise and meal prepping - Craves sugar and feels 'discombobulated' - Concerned about possible elevated insulin levels  ADHD - Requesting Adderall refill  Anxiety - Requesting Ativan  refill  Medication use and requests - Current medications include birth control pills and vitamin D  supplements       ROS Per HPI  Outpatient Encounter Medications as of 04/29/2024  Medication Sig   clindamycin -benzoyl peroxide (BENZACLIN) gel Apply topically 2 (two) times daily.   drospirenone -ethinyl estradiol  (YAZ) 3-0.02 MG tablet Take 1 tablet by mouth daily.   LINZESS  145 MCG CAPS capsule 1 capsule at least 30 minutes before the first meal of the day on an empty stomach Orally Once a day for 90 days   metroNIDAZOLE  (METROGEL ) 0.75 % vaginal gel Place 1 Applicatorful vaginally at bedtime. Apply one applicatorful to vagina twice weekly as preventive    Multiple Vitamin (MULTIVITAMIN PO) Take by mouth.   saccharomyces boulardii (FLORASTOR) 250 MG capsule as directed Orally once a day   tretinoin (RETIN-A) 0.05 % cream 1 application in the evening to face Externally Once a day   valACYclovir  (VALTREX ) 500 MG tablet Take 1 tablet (500 mg total) by mouth daily.   [DISCONTINUED] amphetamine -dextroamphetamine (ADDERALL XR) 20 MG 24 hr capsule Take 20 mg by mouth daily.   [DISCONTINUED] amphetamine -dextroamphetamine (ADDERALL XR) 30 MG 24 hr capsule 1 capsule in the morning Orally Once a day (01/08/23) for 30 days   [DISCONTINUED] LORazepam  (ATIVAN ) 1 MG tablet Take 1 mg by mouth at bedtime as needed.   amphetamine -dextroamphetamine (ADDERALL XR) 20 MG 24 hr capsule Take 1 capsule (20 mg total) by mouth daily.   amphetamine -dextroamphetamine (ADDERALL XR) 30 MG 24 hr capsule Take 1 capsule (30 mg total) by mouth daily.   LORazepam  (ATIVAN ) 1 MG tablet Take 1 tablet (1 mg total) by mouth at bedtime as needed for anxiety or sleep.   [DISCONTINUED] azelastine  (ASTELIN ) 0.1 % nasal spray Place 2 sprays into both nostrils 2 (two) times daily. (Patient not taking: Reported on 10/31/2019)   [DISCONTINUED] clindamycin  (CLEOCIN  T) 1 % lotion 1 application Externally Twice a day   [DISCONTINUED] ERY 2 % PADS Apply topically.   [DISCONTINUED] fluticasone  (FLONASE ) 50 MCG/ACT nasal spray Place 2 sprays into both nostrils daily. (Patient not taking: Reported on 10/31/2019)   No facility-administered encounter medications on file as of 04/29/2024.    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 pylori gastritis, 12.8-16mm savory dilation   ESOPHAGOGASTRODUODENOSCOPY (EGD) WITH PROPOFOL  N/A 08/07/2015   Procedure: ESOPHAGOGASTRODUODENOSCOPY (EGD) WITH PROPOFOL ;  Surgeon: Gladis RAYMOND Mariner, MD;  Location[facility] ENDOSCOPY;  Service: Endoscopy;  Laterality: N/A;   INTRAUTERINE DEVICE (IUD) INSERTION     ovarian cyst removed     uterus tacked  2012    Family History  Problem Relation Age of Onset   Hypertension Mother    Stomach cancer Maternal Grandmother 26   Pancreatic cancer Paternal Grandfather 65   Diabetes Mellitus II Maternal Aunt    Hypertension Maternal Aunt    Breast cancer Maternal Aunt 60   Pancreatic cancer Maternal Aunt 55   Pancreatic cancer Paternal Uncle 75    Social History   Socioeconomic History   Marital status: Single    Spouse name: Not on file   Number of children: 0   Years of education: Not on file   Highest education level: Associate degree: occupational, Scientist, product/process development, or vocational program  Occupational History    Employer: [employer]  Tobacco Use   Smoking status: Never   Smokeless tobacco: Never  Vaping Use   Vaping status: Never Used  Substance and Sexual Activity   Alcohol use: Yes    Comment: soc   Drug use: No   Sexual activity: Yes    Birth control/protection: Pill  Other Topics Concern   Not on file  Social History Narrative   Lives w/ mother   Social Drivers of Health   Financial Resource Strain: Low Risk  (04/29/2024)   Overall Financial Resource Strain (CARDIA)    Difficulty of Paying Living Expenses: Not very hard  Food Insecurity: No  Food Insecurity (04/29/2024)   Hunger Vital Sign    Worried About Running Out of Food in the Last Year: Never true    Ran Out of Food in the Last Year: Never true  Transportation Needs: No Transportation Needs (04/29/2024)   PRAPARE - Administrator, Civil Service (Medical): No    Lack of Transportation (Non-Medical): No  Physical Activity: Sufficiently Active (04/29/2024)   Exercise Vital Sign    Days of Exercise per Week: 5 days    Minutes of Exercise per Session: 60 min  Stress: Stress Concern Present (04/29/2024)   [employer] of Occupational Health - Occupational Stress Questionnaire    Feeling of Stress: To some extent  Social Connections: Moderately Isolated (04/29/2024)   Social Connection and Isolation Panel    Frequency of Communication with Friends and Family: More than three times a week    Frequency of Social Gatherings with Friends and Family: Twice a week    Attends Religious Services: 1 to 4 times per year  Active Member of Clubs or Organizations: No    Attends Banker Meetings: Not on file    Marital Status: Never married  Intimate Partner Violence: Not on file       Objective:    BP 120/84 (BP Location: Left Arm, Patient Position: Sitting)   Temp 98.4 F (36.9 C) (Temporal)   Ht 5' 4 (1.626 m)   Wt 151 lb 9.6 oz (68.8 kg)   BMI 26.02 kg/m    Physical Exam Vitals and nursing note reviewed.  Constitutional:      General: She is not in acute distress.    Appearance: Normal appearance. She is normal weight.  HENT:     Head: Normocephalic and atraumatic.     Right Ear: External ear normal.     Left Ear: External ear normal.     Nose: Nose normal.     Mouth/Throat:     Mouth: Mucous membranes are moist.     Pharynx: Oropharynx is clear.  Eyes:     Extraocular Movements: Extraocular movements intact.     Pupils: Pupils are equal, round, and reactive to light.  Cardiovascular:     Rate and Rhythm: Normal rate and regular  rhythm.     Pulses: Normal pulses.     Heart sounds: Normal heart sounds.  Pulmonary:     Effort: Pulmonary effort is normal. No respiratory distress.     Breath sounds: Normal breath sounds. No wheezing, rhonchi or rales.  Musculoskeletal:        General: Normal range of motion.     Cervical back: Normal range of motion.     Right lower leg: No edema.     Left lower leg: No edema.  Lymphadenopathy:     Cervical: No cervical adenopathy.  Skin:    Comments: Mild acne noted to the chin and both sides of lower jaw.  Neurological:     General: No focal deficit present.     Mental Status: She is alert and oriented to person, place, and time.  Psychiatric:        Mood and Affect: Mood normal.        Thought Content: Thought content normal.     No results found for any visits on 04/29/24.      Assessment & Plan:   Assessment and Plan Assessment & Plan Amenorrhea and suspected polycystic ovary syndrome (PCOS) with associated hirsutism and acne Amenorrhea persists post-Depo-Provera . Birth control pills initiated without menstrual cycle resumption. PCOS suspected due to acne and hirsutism. Previous ultrasound done, but results unknown. Hormonal imbalance suspected. - Order hormone level tests including testosterone  and insulin. - Consider metformin if insulin resistance confirmed. - Discussed potential for PCOS and its symptoms including irregular periods, acne, and hirsutism. - Benza-clin to  pharmacy  Possible insulin resistance and metabolic syndrome Frequent sugar cravings and low energy. Inconsistent eating habits despite regular exercise and meal prep. Low vitamin D , currently supplemented. Insulin resistance suspected.  Attention-deficit hyperactivity disorder (ADHD) and anxiety disorder, medication management Current medications include Adderall and Ativan .  - Prescribe Adderall and Ativan  with appropriate refills. - Ensure prescriptions are sent to preferred pharmacy for  convenience.     Orders Placed This Encounter  Procedures   VITAMIN D  25 Hydroxy (Vit-D Deficiency, Fractures)   TSH   Thyroid Profile   Insulin, random   CBC w/Diff   Comp Met (CMET)   Lipid panel    Standing Status:   Future    Number  of Occurrences:   1    Expiration Date:   04/29/2025     Meds ordered this encounter  Medications   amphetamine -dextroamphetamine (ADDERALL XR) 20 MG 24 hr capsule    Sig: Take 1 capsule (20 mg total) by mouth daily.    Dispense:  30 capsule    Refill:  0   amphetamine -dextroamphetamine (ADDERALL XR) 30 MG 24 hr capsule    Sig: Take 1 capsule (30 mg total) by mouth daily.    Dispense:  30 capsule    Refill:  0   LORazepam  (ATIVAN ) 1 MG tablet    Sig: Take 1 tablet (1 mg total) by mouth at bedtime as needed for anxiety or sleep.    Dispense:  30 tablet    Refill:  3   clindamycin -benzoyl peroxide (BENZACLIN) gel    Sig: Apply topically 2 (two) times daily.    Dispense:  25 g    Refill:  3    Return in about 6 months (around 10/30/2024) for meds.  Corean LITTIE Ku, FNP

## 2024-04-29 NOTE — Patient Instructions (Addendum)
 Welcome to [facility]!  Thank you for choosing us  for your Primary Care needs.   We offer in person and video appointments for your convenience. You may call our office to schedule appointments, or you may schedule appointments with me through MyChart.   The best way to get in contact with me is via MyChart message. This will get to me faster than a phone call, unless there is an emergency, then please call 911.  The lab is located downstairs in the Sports Medicine building, we also have xray available there.   We are checking labs today, will be in contact with any results that require further attention.  Follow-up with me in 6 mos for medication management, sooner if needed.  Andrea Glatter at [facility]  [phone]

## 2024-05-01 LAB — CBC WITH DIFFERENTIAL/PLATELET
Basophils Absolute: 0 x10E3/uL (ref 0.0–0.2)
Basos: 1 %
EOS (ABSOLUTE): 0.1 x10E3/uL (ref 0.0–0.4)
Eos: 1 %
Hematocrit: 43.1 % (ref 34.0–46.6)
Hemoglobin: 14.1 g/dL (ref 11.1–15.9)
Immature Grans (Abs): 0 x10E3/uL (ref 0.0–0.1)
Immature Granulocytes: 0 %
Lymphocytes Absolute: 3 x10E3/uL (ref 0.7–3.1)
Lymphs: 52 %
MCH: 32.5 pg (ref 26.6–33.0)
MCHC: 32.7 g/dL (ref 31.5–35.7)
MCV: 99 fL — ABNORMAL HIGH (ref 79–97)
Monocytes Absolute: 0.4 x10E3/uL (ref 0.1–0.9)
Monocytes: 7 %
Neutrophils Absolute: 2.2 x10E3/uL (ref 1.4–7.0)
Neutrophils: 39 %
Platelets: 236 x10E3/uL (ref 150–450)
RBC: 4.34 x10E6/uL (ref 3.77–5.28)
RDW: 12 % (ref 11.7–15.4)
WBC: 5.7 x10E3/uL (ref 3.4–10.8)

## 2024-05-01 LAB — COMPREHENSIVE METABOLIC PANEL WITH GFR
ALT: 20 IU/L (ref 0–32)
AST: 44 IU/L — ABNORMAL HIGH (ref 0–40)
Albumin: 4.3 g/dL (ref 3.9–4.9)
Alkaline Phosphatase: 43 IU/L — ABNORMAL LOW (ref 44–121)
BUN/Creatinine Ratio: 14 (ref 9–23)
BUN: 12 mg/dL (ref 6–20)
Bilirubin Total: 0.4 mg/dL (ref 0.0–1.2)
CO2: 22 mmol/L (ref 20–29)
Calcium: 9.4 mg/dL (ref 8.7–10.2)
Chloride: 100 mmol/L (ref 96–106)
Creatinine, Ser: 0.86 mg/dL (ref 0.57–1.00)
Globulin, Total: 2.6 g/dL (ref 1.5–4.5)
Glucose: 80 mg/dL (ref 70–99)
Potassium: 4.2 mmol/L (ref 3.5–5.2)
Sodium: 134 mmol/L (ref 134–144)
Total Protein: 6.9 g/dL (ref 6.0–8.5)
eGFR: 90 mL/min/1.73 (ref >59)

## 2024-05-01 LAB — LIPID PANEL
Chol/HDL Ratio: 2.5 ratio (ref 0.0–4.4)
Cholesterol, Total: 223 mg/dL — ABNORMAL HIGH (ref 100–199)
HDL: 88 mg/dL (ref >39)
LDL Chol Calc (NIH): 124 mg/dL — ABNORMAL HIGH (ref 0–99)
Triglycerides: 66 mg/dL (ref 0–149)
VLDL Cholesterol Cal: 11 mg/dL (ref 5–40)

## 2024-05-01 LAB — VITAMIN D 25 HYDROXY (VIT D DEFICIENCY, FRACTURES): Vit D, 25-Hydroxy: 132 ng/mL — ABNORMAL HIGH (ref 30.0–100.0)

## 2024-05-01 LAB — INSULIN, RANDOM: INSULIN: 5.9 u[IU]/mL (ref 2.6–24.9)

## 2024-05-01 LAB — THYROID PANEL
Free Thyroxine Index: 2.2 (ref 1.2–4.9)
T3 Uptake Ratio: 23 % — ABNORMAL LOW (ref 24–39)
T4, Total: 9.5 ug/dL (ref 4.5–12.0)

## 2024-05-01 LAB — TSH: TSH: 1.48 u[IU]/mL (ref 0.450–4.500)

## 2024-05-03 ENCOUNTER — Ambulatory Visit: Payer: Self-pay | Admitting: Family Medicine

## 2024-05-18 ENCOUNTER — Other Ambulatory Visit: Payer: Self-pay

## 2024-05-18 DIAGNOSIS — A6004 Herpesviral vulvovaginitis: Secondary | ICD-10-CM

## 2024-05-19 ENCOUNTER — Telehealth: Payer: Self-pay

## 2024-05-31 ENCOUNTER — Encounter: Payer: Self-pay | Admitting: Family Medicine

## 2024-06-01 ENCOUNTER — Other Ambulatory Visit: Payer: Self-pay | Admitting: Family Medicine

## 2024-06-01 DIAGNOSIS — Z79899 Other long term (current) drug therapy: Secondary | ICD-10-CM

## 2024-06-01 DIAGNOSIS — F900 Attention-deficit hyperactivity disorder, predominantly inattentive type: Secondary | ICD-10-CM

## 2024-06-01 MED ORDER — AMPHETAMINE-DEXTROAMPHET ER 20 MG PO CP24: 20.0000 mg | ORAL_CAPSULE | Freq: Every day | ORAL | 0 refills | Status: DC

## 2024-06-01 MED ORDER — AMPHETAMINE-DEXTROAMPHET ER 30 MG PO CP24: 30.0000 mg | ORAL_CAPSULE | Freq: Every day | ORAL | 0 refills | Status: DC

## 2024-06-21 ENCOUNTER — Encounter: Payer: Self-pay | Admitting: Family Medicine

## 2024-07-01 ENCOUNTER — Other Ambulatory Visit: Payer: Self-pay | Admitting: Family Medicine

## 2024-07-01 DIAGNOSIS — F900 Attention-deficit hyperactivity disorder, predominantly inattentive type: Secondary | ICD-10-CM

## 2024-07-01 DIAGNOSIS — Z79899 Other long term (current) drug therapy: Secondary | ICD-10-CM

## 2024-07-01 MED ORDER — AMPHETAMINE-DEXTROAMPHET ER 30 MG PO CP24: 30.0000 mg | ORAL_CAPSULE | Freq: Every day | ORAL | 0 refills | Status: DC

## 2024-07-01 MED ORDER — AMPHETAMINE-DEXTROAMPHET ER 20 MG PO CP24: 20.0000 mg | ORAL_CAPSULE | Freq: Every day | ORAL | 0 refills | Status: DC

## 2024-07-08 ENCOUNTER — Ambulatory Visit: Admitting: Family Medicine

## 2024-08-11 ENCOUNTER — Encounter: Payer: Self-pay | Admitting: Obstetrics and Gynecology

## 2024-09-03 ENCOUNTER — Encounter: Payer: Self-pay | Admitting: Family Medicine

## 2024-09-03 DIAGNOSIS — F900 Attention-deficit hyperactivity disorder, predominantly inattentive type: Secondary | ICD-10-CM

## 2024-09-03 DIAGNOSIS — Z79899 Other long term (current) drug therapy: Secondary | ICD-10-CM

## 2024-09-03 MED ORDER — AMPHETAMINE-DEXTROAMPHET ER 20 MG PO CP24: 20.0000 mg | ORAL_CAPSULE | Freq: Every day | ORAL | 0 refills | Status: DC

## 2024-09-03 MED ORDER — AMPHETAMINE-DEXTROAMPHET ER 30 MG PO CP24: 30.0000 mg | ORAL_CAPSULE | Freq: Every day | ORAL | 0 refills | Status: DC

## 2024-10-15 ENCOUNTER — Ambulatory Visit: Payer: Self-pay | Admitting: Family Medicine

## 2024-10-15 ENCOUNTER — Encounter: Payer: Self-pay | Admitting: Family Medicine

## 2024-10-15 VITALS — BP 122/70 | HR 92 | Temp 98.8°F | Wt 156.0 lb

## 2024-10-15 DIAGNOSIS — A6004 Herpesviral vulvovaginitis: Secondary | ICD-10-CM

## 2024-10-15 DIAGNOSIS — N926 Irregular menstruation, unspecified: Secondary | ICD-10-CM

## 2024-10-15 DIAGNOSIS — F900 Attention-deficit hyperactivity disorder, predominantly inattentive type: Secondary | ICD-10-CM

## 2024-10-15 MED ORDER — AMPHETAMINE-DEXTROAMPHET ER 30 MG PO CP24: 30.0000 mg | ORAL_CAPSULE | Freq: Every day | ORAL | 0 refills | Status: AC

## 2024-10-15 MED ORDER — AMPHETAMINE-DEXTROAMPHET ER 20 MG PO CP24: 20.0000 mg | ORAL_CAPSULE | Freq: Every day | ORAL | 0 refills | Status: AC

## 2024-10-15 MED ORDER — VALACYCLOVIR HCL 500 MG PO TABS: 500.0000 mg | ORAL_TABLET | Freq: Every day | ORAL | 3 refills | Status: AC

## 2024-10-15 NOTE — Progress Notes (Unsigned)
 "  Established Patient Office Visit  Subjective:     Patient ID: Beth Peterson, female    DOB: Jun 11, 1991, 34 y.o.   MRN: [id number]  Chief Complaint  Patient presents with   Medication Refill    HPI  Discussed the use of AI scribe software for clinical note transcription with the patient, who gave verbal consent to proceed.  History of Present Illness Beth Peterson is a 34 year old female who presents for medication refills and discussion of menstrual irregularities.  Medication access issues - Difficulty obtaining a 1-month supply of medications, particularly at [facility]. - [facility] pharmacy has been more reliable for medication refills. - [facility] has not filled both prescriptions simultaneously, affecting medication adherence.  Antiviral medication refill - Requests Valtrex  refill due to incomplete gynecological physical, which was due in March. - Concerned about running out of Valtrex  before next gynecological evaluation.  Menstrual irregularities - After discontinuing Depo-Provera  and initiating oral contraceptive pills, experienced irregular menstrual cycles. - Recently discontinued oral contraceptive pills and subsequently had a menstrual period. - Oral contraceptive pill pack contained only four placebo (white) pills, perceived as insufficient for adequate withdrawal bleeding. - Currently not using hormonal contraception and monitoring for regularity of menstrual cycles.  Adverse cutaneous reaction - History of severe skin reaction to a topical cream, characterized by marked erythema. - Reaction was documented with photographs sent to the office at the time of occurrence.     ROS Per HPI      Objective:    BP 122/70   Pulse 92   Temp 98.8 F (37.1 C) (Temporal)   Wt 156 lb (70.8 kg)   SpO2 96%   BMI 26.78 kg/m    Physical Exam Vitals and nursing note reviewed.  Constitutional:      General: She is not in acute distress.    Appearance: Normal  appearance. She is normal weight.  HENT:     Head: Normocephalic and atraumatic.     Right Ear: External ear normal.     Left Ear: External ear normal.     Nose: Nose normal.     Mouth/Throat:     Mouth: Mucous membranes are moist.     Pharynx: Oropharynx is clear.  Eyes:     Extraocular Movements: Extraocular movements intact.     Pupils: Pupils are equal, round, and reactive to light.  Cardiovascular:     Rate and Rhythm: Normal rate and regular rhythm.     Pulses: Normal pulses.     Heart sounds: Normal heart sounds.  Pulmonary:     Effort: Pulmonary effort is normal. No respiratory distress.     Breath sounds: Normal breath sounds. No wheezing, rhonchi or rales.  Musculoskeletal:        General: Normal range of motion.     Cervical back: Normal range of motion.     Right lower leg: No edema.     Left lower leg: No edema.  Lymphadenopathy:     Cervical: No cervical adenopathy.  Neurological:     General: No focal deficit present.     Mental Status: She is alert and oriented to person, place, and time.  Psychiatric:        Mood and Affect: Mood normal.        Thought Content: Thought content normal.     No results found for any visits on 10/15/24.   BP Readings from Last 3 Encounters:  10/15/24 122/70  04/29/24 120/84  11/27/23 96/70  Wt Readings from Last 3 Encounters:  10/15/24 156 lb (70.8 kg)  04/29/24 151 lb 9.6 oz (68.8 kg)  11/27/23 155 lb (70.3 kg)           Assessment & Plan:   Assessment and Plan Assessment & Plan Attention-deficit hyperactivity disorder (ADHD), predominantly inattentive type ADHD management with Adderall XR ongoing. Pharmacy refill issues noted. - Sent Adderall XR prescription to [facility] for refill. - Advised to request three-month supply to minimize refill issues.  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 Refill requested due to potential appointment delay. - Sent  Valacyclovir  prescription to [facility] for a 90-day supply.  Irregular periods Amenorrhea improved post birth control discontinuation. Monitoring cycle stability without hormonal therapy. - Continue monitoring menstrual cycle without hormonal therapy.     No orders of the defined types were placed in this encounter.    Meds ordered this encounter  Medications   valACYclovir  (VALTREX ) 500 MG tablet    Sig: Take 1 tablet (500 mg total) by mouth daily.    Dispense:  90 tablet    Refill:  3   amphetamine -dextroamphetamine (ADDERALL XR) 30 MG 24 hr capsule    Sig: Take 1 capsule (30 mg total) by mouth daily.    Dispense:  30 capsule    Refill:  0   amphetamine -dextroamphetamine (ADDERALL XR) 20 MG 24 hr capsule    Sig: Take 1 capsule (20 mg total) by mouth daily.    Dispense:  30 capsule    Refill:  0   amphetamine -dextroamphetamine (ADDERALL XR) 30 MG 24 hr capsule    Sig: Take 1 capsule (30 mg total) by mouth daily.    Dispense:  30 capsule    Refill:  0   amphetamine -dextroamphetamine (ADDERALL XR) 30 MG 24 hr capsule    Sig: Take 1 capsule (30 mg total) by mouth daily.    Dispense:  30 capsule    Refill:  0   amphetamine -dextroamphetamine (ADDERALL XR) 20 MG 24 hr capsule    Sig: Take 1 capsule (20 mg total) by mouth daily.    Dispense:  30 capsule    Refill:  0   amphetamine -dextroamphetamine (ADDERALL XR) 20 MG 24 hr capsule    Sig: Take 1 capsule (20 mg total) by mouth daily.    Dispense:  30 capsule    Refill:  0    No follow-ups on file.  Corean LITTIE Ku, FNP   "

## 2025-04-14 ENCOUNTER — Ambulatory Visit: Admitting: Family Medicine
